# Patient Record
Sex: Female | Born: 1935 | Race: White | Hispanic: No | Smoking: Never smoker
Health system: Southern US, Community
[De-identification: ages and names within clinical notes are randomized; demographics above are authoritative.]

## PROBLEM LIST (undated history)

## (undated) DIAGNOSIS — I671 Cerebral aneurysm, nonruptured: Secondary | ICD-10-CM

## (undated) DIAGNOSIS — K859 Acute pancreatitis without necrosis or infection, unspecified: Secondary | ICD-10-CM

## (undated) DIAGNOSIS — I509 Heart failure, unspecified: Secondary | ICD-10-CM

## (undated) DIAGNOSIS — J8410 Pulmonary fibrosis, unspecified: Secondary | ICD-10-CM

## (undated) HISTORY — DX: Pulmonary fibrosis, unspecified: J84.10

## (undated) HISTORY — DX: Heart failure, unspecified: I50.9

## (undated) HISTORY — PX: OTHER SURGICAL HISTORY: SHX169

## (undated) HISTORY — PX: APPENDECTOMY: SHX54

---

## 1996-11-23 DIAGNOSIS — I671 Cerebral aneurysm, nonruptured: Secondary | ICD-10-CM

## 1996-11-23 HISTORY — DX: Cerebral aneurysm, nonruptured: I67.1

## 2001-05-18 ENCOUNTER — Encounter: Payer: Self-pay | Admitting: Emergency Medicine

## 2001-05-18 ENCOUNTER — Inpatient Hospital Stay (HOSPITAL_COMMUNITY): Admission: EM | Admit: 2001-05-18 | Discharge: 2001-05-30 | Payer: Self-pay | Admitting: Emergency Medicine

## 2001-05-18 ENCOUNTER — Encounter: Payer: Self-pay | Admitting: Internal Medicine

## 2001-05-19 ENCOUNTER — Encounter: Payer: Self-pay | Admitting: Internal Medicine

## 2001-05-20 ENCOUNTER — Encounter: Payer: Self-pay | Admitting: Internal Medicine

## 2001-05-22 ENCOUNTER — Encounter: Payer: Self-pay | Admitting: Internal Medicine

## 2001-05-23 ENCOUNTER — Encounter: Payer: Self-pay | Admitting: Internal Medicine

## 2001-05-25 ENCOUNTER — Encounter: Payer: Self-pay | Admitting: Internal Medicine

## 2001-05-28 ENCOUNTER — Encounter: Payer: Self-pay | Admitting: Internal Medicine

## 2001-06-08 ENCOUNTER — Encounter: Admission: RE | Admit: 2001-06-08 | Discharge: 2001-06-08 | Payer: Self-pay | Admitting: Internal Medicine

## 2005-04-20 ENCOUNTER — Ambulatory Visit: Payer: Self-pay | Admitting: Internal Medicine

## 2006-06-02 ENCOUNTER — Ambulatory Visit: Payer: Self-pay | Admitting: Internal Medicine

## 2007-08-18 ENCOUNTER — Ambulatory Visit: Payer: Self-pay | Admitting: Internal Medicine

## 2008-04-29 ENCOUNTER — Emergency Department: Payer: Self-pay | Admitting: Emergency Medicine

## 2008-08-21 ENCOUNTER — Ambulatory Visit: Payer: Self-pay | Admitting: Internal Medicine

## 2009-08-22 ENCOUNTER — Ambulatory Visit: Payer: Self-pay | Admitting: Internal Medicine

## 2010-10-28 ENCOUNTER — Ambulatory Visit: Payer: Self-pay | Admitting: Internal Medicine

## 2011-11-03 ENCOUNTER — Ambulatory Visit: Payer: Self-pay | Admitting: Internal Medicine

## 2012-11-08 ENCOUNTER — Ambulatory Visit: Payer: Self-pay | Admitting: Internal Medicine

## 2013-11-14 ENCOUNTER — Ambulatory Visit: Payer: Self-pay | Admitting: Family Medicine

## 2014-03-30 ENCOUNTER — Inpatient Hospital Stay: Payer: Self-pay | Admitting: Family Medicine

## 2014-03-30 LAB — COMPREHENSIVE METABOLIC PANEL
Albumin: 2.9 g/dL — ABNORMAL LOW (ref 3.4–5.0)
Alkaline Phosphatase: 78 U/L
Anion Gap: 4 — ABNORMAL LOW (ref 7–16)
BUN: 17 mg/dL (ref 7–18)
Bilirubin,Total: 0.8 mg/dL (ref 0.2–1.0)
Calcium, Total: 9.5 mg/dL (ref 8.5–10.1)
Chloride: 101 mmol/L (ref 98–107)
Co2: 30 mmol/L (ref 21–32)
Creatinine: 0.95 mg/dL (ref 0.60–1.30)
EGFR (African American): >60
EGFR (Non-African Amer.): 55 — ABNORMAL LOW
Glucose: 130 mg/dL — ABNORMAL HIGH (ref 65–99)
Osmolality: 273 (ref 275–301)
Potassium: 3.9 mmol/L (ref 3.5–5.1)
SGOT(AST): 25 U/L (ref 15–37)
SGPT (ALT): 17 U/L (ref 12–78)
Sodium: 135 mmol/L — ABNORMAL LOW (ref 136–145)
Total Protein: 7.3 g/dL (ref 6.4–8.2)

## 2014-03-30 LAB — CBC
HCT: 47 % (ref 35.0–47.0)
HGB: 14.9 g/dL (ref 12.0–16.0)
MCH: 30 pg (ref 26.0–34.0)
MCHC: 31.8 g/dL — ABNORMAL LOW (ref 32.0–36.0)
MCV: 94 fL (ref 80–100)
Platelet: 207 10*3/uL (ref 150–440)
RBC: 4.98 10*6/uL (ref 3.80–5.20)
RDW: 13.8 % (ref 11.5–14.5)
WBC: 10.3 10*3/uL (ref 3.6–11.0)

## 2014-03-30 LAB — TROPONIN I: Troponin-I: <0.02 ng/mL

## 2014-03-30 LAB — CK TOTAL AND CKMB (NOT AT ARMC)
CK, Total: 99 U/L
CK-MB: 2.1 ng/mL (ref 0.5–3.6)

## 2014-04-02 LAB — CREATININE, SERUM
Creatinine: 0.83 mg/dL (ref 0.60–1.30)
EGFR (African American): >60
EGFR (Non-African Amer.): >60

## 2014-04-02 LAB — PLATELET COUNT: Platelet: 259 10*3/uL (ref 150–440)

## 2014-04-04 LAB — CULTURE, BLOOD (SINGLE)

## 2015-03-16 NOTE — H&P (Signed)
PATIENT NAME:  [name], [name] MR#:  [id number] DATE OF BIRTH:  [birth year]  DATE OF ADMISSION:  03/30/2014  REFERRING PHYSICIAN:  Dr. Scotty Court  PRIMARY CARE PHYSICIAN:  Dr. Larwance Sachs    CHIEF COMPLAINT:  Shortness of breath.   HISTORY OF PRESENT ILLNESS:  An 81 year old Caucasian female with past medical history of gastroesophageal reflux disease, who presents with shortness of breath. She describes URI-like symptoms for 1 to 2-week duration, as well as associated seasonal allergies. She has been complaining of postnasal drip and occasionally productive cough with clear sputum, as well as associated mild dyspnea on exertion. For the above symptoms, she went to a walk-in clinic and noted to be hypoxemic at that time, saturating around 84% on room air. Subsequently, sent to the Emergency Department for further workup and evaluation. She denies any fevers, chills, or further symptomatology.   REVIEW OF SYSTEMS:  CONSTITUTIONAL:  Denies fever, chills, fatigue, weakness.  EYES:  Denies blurry vision, double vision, eye pain. ENT:  Denies tinnitus, ear pain, hearing loss. Positive postnasal drip.  RESPIRATORY:  Positive for cough, wheeze, shortness of breath as described above.  CARDIOVASCULAR:  Denies chest pain, palpitations, edema.  GASTROINTESTINAL:  Denies nausea, vomiting, diarrhea, abdominal pain.  GENITOURINARY:  Denies dysuria, hematuria.  ENDOCRINE:  Denies nocturia or thyroid problems.  HEMATOLOGIC AND LYMPHATIC:  Denies easy bruising and bleeding.  SKIN:  Denies rashes or lesions. MUSCULOSKELETAL:  Denies pain in neck, back, shoulders, knees, hips, or arthritic symptoms.  NEUROLOGIC:  Denies paralysis or paresthesia.  PSYCHIATRIC:  Denies anxiety or depressive symptoms.  Otherwise, full review of systems performed is negative.   PAST MEDICAL HISTORY:  Vitamin D deficiency, gastroesophageal reflux disease, history of brain aneurysm status post clipping.   SOCIAL HISTORY:  Remote tobacco  usage. Denies any alcohol or drug usage. Uses a cane for ambulation.   FAMILY HISTORY:  Positive for breast cancer.   ALLERGIES:  No known drug allergies.   HOME MEDICATIONS:  Include certrizine 10 mg p.o. daily, ursodiol 300 mg p.o. b.i.d., CoQ10 at 50 mg p.o. daily, and Prilosec 20 mg p.o. daily.   PHYSICAL EXAMINATION:  VITAL SIGNS:  Temperature 98.2, heart rate 85, respirations 20, blood pressure 123/59, saturating 84% on room air, saturating 94% on 2 L cannula. Weight 73.5 kg, BMI 31.6.  GENERAL:  Well-nourished, well-developed, Caucasian female, currently in no acute distress.  HEAD:  Normocephalic, atraumatic.  EYES:  Pupils equal, round, reactive to light. Extraocular muscles intact. No scleral icterus.  MOUTH:  Moist mucous membrane. Dentition intact. No abscess noted. EARS, NOSE, THROAT:  Throat clear without exudates. No external lesions. NECK:  Supple. No thyromegaly, no nodules, no JVD.  PULMONARY:  Diffuse scant expiratory wheeze with bibasilar rhonchi noted. No use of accessory muscles. Good respiratory effort.  CHEST:  Nontender to palpation.  CARDIOVASCULAR:  S1, S2. Regular rate and rhythm. No murmurs, rubs, or gallops. No edema. Pedal pulses 2+ bilaterally. GASTROINTESTINAL:  Soft, nontender, nondistended. No masses. Positive bowel sounds. No hepatosplenomegaly.  MUSCULOSKELETAL:  No swelling, clubbing, edema. Range of motion full in all extremities.  NEUROLOGIC:  Cranial nerves II through XII intact. No gross focal neurological deficits. Sensation intact. Reflexes intact. SKIN:  No ulcerations, lesions, rashes, cyanosis. Skin warm and dry. Turgor intact.  PSYCHIATRIC:  Mood and affect within normal limits. The patient is awake, alert, and oriented x3. Insight and judgment intact.   LABORATORY DATA:  Sodium 135, potassium 3.9, chloride 101, bicarb 30, BUN 17, creatinine 0.95,  glucose 130, albumin 2.9. Otherwise LFTs within normal limits. WBC 10.3, hemoglobin 14.9,  platelets 207. ABG performed; 7.34/50/99/27. Chest x-ray performed; revealing findings consistent with chronic interstitial lung disease/pulmonary fibrosis. However, no acute cardiopulmonary process.   ASSESSMENT AND PLAN:  An 81 year old Caucasian female presenting with shortness of breath.  1.  Hypoxia/bronchitis. Provide supplemental oxygen to keep oxygen saturation greater than 90%. DuoNeb therapy q.4 hours. Solu-Medrol 60 mg IV daily, as well as flutter valve therapy. We will provide Levaquin for antibiotic coverage, as well as its anti-inflammatory effects.  2.  Hyponatremia. Gentle IV fluid hydration with normal saline. Follow sodium levels. 3.  Gastroesophageal reflux disease. Continue PPI therapy.  4.  Venous thromboembolism prophylaxis with heparin subcutaneously.   The patient is FULL CODE.  TIME SPENT: 45 minutes.   ____________________________ Kellie Athensavid K. Hower, MD dkh:ms D: 03/30/2014 22:56:40 ET T: 03/30/2014 23:48:43 ET JOB#: [id number]  cc: Kellie Athensavid K. Hower, MD, <Dictator> DAVID Synetta ShadowK HOWER MD ELECTRONICALLY SIGNED 03/31/2014 20:48

## 2015-03-16 NOTE — Discharge Summary (Signed)
PATIENT NAME:  [name], [name] MR#:  [id number] DATE OF BIRTH:  1926/05/31  DATE OF ADMISSION:  03/30/2014 DATE OF DISCHARGE:  04/02/2014  REASON FOR ADMISSION: Acute respiratory failure with hypoxemia secondary to acute bronchitis.  DISPOSITION: Home with oxygen. To be re-evaluated by primary care physician for the need of continuation within the next 2 to 4 weeks.   HISTORY AND HOSPITAL COURSE: The patient is an 81 year old female who has history of GERD and also pulmonary fibrosis as described by the family with some COPD, although she has kept herself very healthy and not needing any oxygen at home or even respiratory treatments. Whenever she gets bronchitis, she always has some desaturation that could last for several days. I am not quite sure if this is the progression of her pulmonary fibrosis, but the patient remains hypoxic for what she technically qualifies for oxygen at home as she desats to 84 during ambulation. The patient has had significant improvement since admission with decreased secretions and decreased cough, although her hypoxia continues. The patient is going to be discharged on home oxygen. As far as her presentation, she described upper respiratory like symptoms for 1 to 2 weeks to the point that she cannot tolerate it for what she was seen by urgent care. Urgent care decided to send her to the Emergency Department. In the Emergency Department, she was found to have an oxygen saturation of 84%.   The patient was admitted on antibiotics with antibiotics with Levaquin, steroids. She was given Advair inhaler and nebulizer for albuterol and she had significant improvement of her symptomatology.   The patient was started and Solu-Medrol IV and now she has been changed for a short course of for prednisone.   I had a long discussion with the family today about the need for the patient to be hospitalized. They did not feel like that she needs to stay any longer. I agree with them. We are  going to send her home today.   DISCHARGE MEDICATIONS: Omeprazole 20 mg daily, ursodiol 300 mg twice daily, cetirizine 10 mg daily, PreserVision 1 capsule twice daily, CoQ10 enzyme once daily, dextromethorphan with guaifenesin syrup as needed over-the-counter, and prednisone 20 mg once a day for 2 days.   The patient is discharged in good condition. Home oxygen to be taken continuously for now, but probably she is only going to need that for activity as she desats mostly with activity. To be reevaluated by primary care physician for the need of continuation in the next 1 to 2 weeks. Follow-up with Dr. Kandyce RudMarcus Babaoff  in the next 7 days.   TIME SPENT: About 45 minutes.  ____________________________ Felipa Furnaceoberto Sanchez Gutierrez, MD rsg:sb D: 04/02/2014 10:11:52 ET T: 04/02/2014 11:05:26 ET JOB#: [id number]  cc: Felipa Furnaceoberto Sanchez Gutierrez, MD, <Dictator> Kandyce RudMarcus Babaoff, MD Pearletha FurlOBERTO SANCHEZ GUTIERRE MD ELECTRONICALLY SIGNED 04/13/2014 22:10

## 2017-01-15 ENCOUNTER — Emergency Department: Payer: Medicare Other

## 2017-01-15 ENCOUNTER — Inpatient Hospital Stay
Admission: EM | Admit: 2017-01-15 | Discharge: 2017-01-23 | DRG: 291 | Disposition: A | Discharge status: Home Health | Payer: Medicare Other | Attending: Internal Medicine | Admitting: Internal Medicine

## 2017-01-15 DIAGNOSIS — Z7951 Long term (current) use of inhaled steroids: Secondary | ICD-10-CM

## 2017-01-15 DIAGNOSIS — R531 Weakness: Secondary | ICD-10-CM | POA: Diagnosis present

## 2017-01-15 DIAGNOSIS — I82401 Acute embolism and thrombosis of unspecified deep veins of right lower extremity: Secondary | ICD-10-CM

## 2017-01-15 DIAGNOSIS — K219 Gastro-esophageal reflux disease without esophagitis: Secondary | ICD-10-CM | POA: Diagnosis present

## 2017-01-15 DIAGNOSIS — K861 Other chronic pancreatitis: Secondary | ICD-10-CM | POA: Diagnosis present

## 2017-01-15 DIAGNOSIS — Z79899 Other long term (current) drug therapy: Secondary | ICD-10-CM

## 2017-01-15 DIAGNOSIS — Z8679 Personal history of other diseases of the circulatory system: Secondary | ICD-10-CM

## 2017-01-15 DIAGNOSIS — I509 Heart failure, unspecified: Secondary | ICD-10-CM

## 2017-01-15 DIAGNOSIS — J849 Interstitial pulmonary disease, unspecified: Secondary | ICD-10-CM | POA: Diagnosis present

## 2017-01-15 DIAGNOSIS — Z9981 Dependence on supplemental oxygen: Secondary | ICD-10-CM

## 2017-01-15 DIAGNOSIS — J9601 Acute respiratory failure with hypoxia: Secondary | ICD-10-CM | POA: Diagnosis present

## 2017-01-15 DIAGNOSIS — J8410 Pulmonary fibrosis, unspecified: Secondary | ICD-10-CM

## 2017-01-15 DIAGNOSIS — R600 Localized edema: Secondary | ICD-10-CM

## 2017-01-15 DIAGNOSIS — Z66 Do not resuscitate: Secondary | ICD-10-CM | POA: Diagnosis present

## 2017-01-15 DIAGNOSIS — F0390 Unspecified dementia without behavioral disturbance: Secondary | ICD-10-CM | POA: Diagnosis present

## 2017-01-15 DIAGNOSIS — N183 Chronic kidney disease, stage 3 (moderate): Secondary | ICD-10-CM | POA: Diagnosis present

## 2017-01-15 DIAGNOSIS — G9341 Metabolic encephalopathy: Secondary | ICD-10-CM

## 2017-01-15 DIAGNOSIS — I82409 Acute embolism and thrombosis of unspecified deep veins of unspecified lower extremity: Secondary | ICD-10-CM

## 2017-01-15 DIAGNOSIS — R339 Retention of urine, unspecified: Secondary | ICD-10-CM | POA: Diagnosis present

## 2017-01-15 DIAGNOSIS — J9621 Acute and chronic respiratory failure with hypoxia: Secondary | ICD-10-CM

## 2017-01-15 DIAGNOSIS — N179 Acute kidney failure, unspecified: Secondary | ICD-10-CM

## 2017-01-15 DIAGNOSIS — Z9109 Other allergy status, other than to drugs and biological substances: Secondary | ICD-10-CM

## 2017-01-15 DIAGNOSIS — I130 Hypertensive heart and chronic kidney disease with heart failure and stage 1 through stage 4 chronic kidney disease, or unspecified chronic kidney disease: Secondary | ICD-10-CM | POA: Diagnosis not present

## 2017-01-15 DIAGNOSIS — I5031 Acute diastolic (congestive) heart failure: Secondary | ICD-10-CM | POA: Insufficient documentation

## 2017-01-15 DIAGNOSIS — H9190 Unspecified hearing loss, unspecified ear: Secondary | ICD-10-CM | POA: Diagnosis present

## 2017-01-15 HISTORY — DX: Acute pancreatitis without necrosis or infection, unspecified: K85.90

## 2017-01-15 HISTORY — DX: Cerebral aneurysm, nonruptured: I67.1

## 2017-01-15 LAB — URINALYSIS, ROUTINE W REFLEX MICROSCOPIC
Bilirubin Urine: NEGATIVE
GLUCOSE, UA: 50 mg/dL — AB
HGB URINE DIPSTICK: NEGATIVE
Ketones, ur: 5 mg/dL — AB
Leukocytes, UA: NEGATIVE
Nitrite: NEGATIVE
Protein, ur: 100 mg/dL — AB
SPECIFIC GRAVITY, URINE: 1.027 (ref 1.005–1.030)
pH: 5 (ref 5.0–8.0)

## 2017-01-15 LAB — BASIC METABOLIC PANEL
Anion gap: 10 (ref 5–15)
BUN: 32 mg/dL — AB (ref 6–20)
CALCIUM: 10.1 mg/dL (ref 8.9–10.3)
CO2: 29 mmol/L (ref 22–32)
Chloride: 102 mmol/L (ref 101–111)
Creatinine, Ser: 1.49 mg/dL — ABNORMAL HIGH (ref 0.44–1.00)
GFR calc Af Amer: 35 mL/min — ABNORMAL LOW (ref >60)
GFR, EST NON AFRICAN AMERICAN: 30 mL/min — AB (ref >60)
GLUCOSE: 137 mg/dL — AB (ref 65–99)
Potassium: 4.8 mmol/L (ref 3.5–5.1)
SODIUM: 141 mmol/L (ref 135–145)

## 2017-01-15 LAB — CBC WITH DIFFERENTIAL/PLATELET
BASOS ABS: 0 10*3/uL (ref 0–0.1)
Basophils Relative: 1 %
EOS ABS: 0 10*3/uL (ref 0–0.7)
EOS PCT: 0 %
HCT: 49.6 % — ABNORMAL HIGH (ref 35.0–47.0)
Hemoglobin: 16 g/dL (ref 12.0–16.0)
LYMPHS ABS: 0.5 10*3/uL — AB (ref 1.0–3.6)
LYMPHS PCT: 7 %
MCH: 31.5 pg (ref 26.0–34.0)
MCHC: 32.3 g/dL (ref 32.0–36.0)
MCV: 97.4 fL (ref 80.0–100.0)
MONO ABS: 0.6 10*3/uL (ref 0.2–0.9)
Monocytes Relative: 10 %
Neutro Abs: 5.2 10*3/uL (ref 1.4–6.5)
Neutrophils Relative %: 82 %
PLATELETS: 167 10*3/uL (ref 150–440)
RBC: 5.09 MIL/uL (ref 3.80–5.20)
RDW: 13.9 % (ref 11.5–14.5)
WBC: 6.4 10*3/uL (ref 3.6–11.0)

## 2017-01-15 LAB — TSH: TSH: 1.255 u[IU]/mL (ref 0.350–4.500)

## 2017-01-15 LAB — TROPONIN I
Troponin I: 0.03 ng/mL (ref <0.03)
Troponin I: 0.03 ng/mL (ref <0.03)

## 2017-01-15 LAB — BRAIN NATRIURETIC PEPTIDE: B NATRIURETIC PEPTIDE 5: 1854 pg/mL — AB (ref 0.0–100.0)

## 2017-01-15 MED ORDER — HEPARIN SODIUM (PORCINE) 5000 UNIT/ML IJ SOLN
5000.0000 [IU] | Freq: Three times a day (TID) | INTRAMUSCULAR | Status: DC
Start: 2017-01-15 — End: 2017-01-17
  Administered 2017-01-16 – 2017-01-17 (×3): 5000 [IU] via SUBCUTANEOUS
  Filled 2017-01-15 (×4): qty 1

## 2017-01-15 MED ORDER — ACETAMINOPHEN 325 MG PO TABS
650.0000 mg | ORAL_TABLET | ORAL | Status: DC | PRN
  Administered 2017-01-16: 650 mg via ORAL
  Filled 2017-01-15: qty 2

## 2017-01-15 MED ORDER — ASPIRIN EC 81 MG PO TBEC
81.0000 mg | DELAYED_RELEASE_TABLET | Freq: Every day | ORAL | Status: DC
  Administered 2017-01-16 – 2017-01-23 (×8): 81 mg via ORAL
  Filled 2017-01-15 (×10): qty 1

## 2017-01-15 MED ORDER — VITAMIN D 1000 UNITS PO TABS
1000.0000 [IU] | ORAL_TABLET | Freq: Every day | ORAL | Status: DC
  Administered 2017-01-16 – 2017-01-23 (×8): 1000 [IU] via ORAL
  Filled 2017-01-15 (×9): qty 1

## 2017-01-15 MED ORDER — FUROSEMIDE 10 MG/ML IJ SOLN
40.0000 mg | Freq: Every day | INTRAMUSCULAR | Status: DC
  Administered 2017-01-15 – 2017-01-16 (×2): 40 mg via INTRAVENOUS
  Filled 2017-01-15 (×2): qty 4

## 2017-01-15 MED ORDER — FUROSEMIDE 10 MG/ML IJ SOLN
40.0000 mg | Freq: Once | INTRAMUSCULAR | Status: AC
  Administered 2017-01-15: 40 mg via INTRAVENOUS
  Filled 2017-01-15: qty 4

## 2017-01-15 MED ORDER — LORATADINE 10 MG PO TABS
10.0000 mg | ORAL_TABLET | Freq: Every day | ORAL | Status: DC
  Administered 2017-01-16 – 2017-01-23 (×8): 10 mg via ORAL
  Filled 2017-01-15 (×9): qty 1

## 2017-01-15 MED ORDER — LISINOPRIL 5 MG PO TABS
2.5000 mg | ORAL_TABLET | Freq: Every day | ORAL | Status: DC
  Administered 2017-01-15 – 2017-01-16 (×2): 2.5 mg via ORAL
  Filled 2017-01-15 (×2): qty 1

## 2017-01-15 MED ORDER — SODIUM CHLORIDE 0.9 % IV SOLN: 250.0000 mL | INTRAVENOUS | Status: DC | PRN

## 2017-01-15 MED ORDER — PANTOPRAZOLE SODIUM 40 MG PO TBEC
40.0000 mg | DELAYED_RELEASE_TABLET | Freq: Every day | ORAL | Status: DC
  Administered 2017-01-15 – 2017-01-23 (×9): 40 mg via ORAL
  Filled 2017-01-15 (×10): qty 1

## 2017-01-15 MED ORDER — PRESERVISION AREDS PO TABS: 1.0000 | ORAL_TABLET | Freq: Two times a day (BID) | ORAL | Status: DC

## 2017-01-15 MED ORDER — ONDANSETRON HCL 4 MG/2ML IJ SOLN: 4.0000 mg | Freq: Four times a day (QID) | INTRAMUSCULAR | Status: DC | PRN

## 2017-01-15 MED ORDER — SODIUM CHLORIDE 0.9% FLUSH: 3.0000 mL | INTRAVENOUS | Status: DC | PRN

## 2017-01-15 MED ORDER — OCUVITE-LUTEIN PO CAPS
1.0000 | ORAL_CAPSULE | Freq: Two times a day (BID) | ORAL | Status: DC
  Administered 2017-01-16 – 2017-01-23 (×15): 1 via ORAL
  Filled 2017-01-15 (×18): qty 1

## 2017-01-15 MED ORDER — CARVEDILOL 3.125 MG PO TABS
3.1250 mg | ORAL_TABLET | Freq: Two times a day (BID) | ORAL | Status: DC
  Administered 2017-01-16 – 2017-01-18 (×5): 3.125 mg via ORAL
  Filled 2017-01-15 (×5): qty 1

## 2017-01-15 MED ORDER — SODIUM CHLORIDE 0.9% FLUSH
3.0000 mL | Freq: Two times a day (BID) | INTRAVENOUS | Status: DC
  Administered 2017-01-16 – 2017-01-23 (×14): 3 mL via INTRAVENOUS

## 2017-01-15 NOTE — ED Notes (Signed)
Family and pt updated on delay for bed.

## 2017-01-15 NOTE — ED Triage Notes (Addendum)
Pt came to ED via EMS from doctors office. Was getting cortisone shot for knee pain, when doctor noticed patient gained 20lbs in 2 months and is concerned for CHF. Pt not normally on oxygen. Placed on 4L [place] and satting 93%.

## 2017-01-15 NOTE — ED Notes (Signed)
Floor was called prior to departure

## 2017-01-15 NOTE — ED Notes (Signed)
Report from felicia, rn.  

## 2017-01-15 NOTE — H&P (Signed)
History and Physical   [facility] - [place] @ [facility] Admission History and Physical [employer], D.O.    Patient Name: [name] MR#: [id number] Date of Birth: Sep 28, 1926 Date of Admission: 01/15/2017  Referring MD/NP/PA: Dr. Mayford Knife Primary Care Physician: Rozanna Box, MD Patient coming from: Home  Chief Complaint: Weight gain  HPI: [name] is a 83 y.o. female with a known history of brain aneurysm and pancreatitis who was sent to the emergency department by her primary care provider. Patient reportedly went to the doctor for knee injections. She was found to have some abnormal breath sounds in the provider determined that she had gained about 20 pounds since December. He therefore referred her to the emergency department for evaluation. Her family reports that she had been in somewhat short of breath on her discharge from the hospital last year and was discharged home with home O2 which she never uses. She was also tried on Symbicort for symptoms of shortness of breath which did not help any. Her family reports that she has had lower extremity edema, progressively worsening shortness of breath over the last 3 days with decreased energy.   Patient is actually independent, lives at home with her son and daughter-in-law.   Otherwise there has been no change in status. Patient has been taking medication as prescribed and there has been no recent change in medication or diet.  No recent antibiotics.  There has been no recent illness, hospitalizations, travel or sick contacts.    Patient denies fevers/chills, weakness, dizziness, chest pain, shortness of breath, N/V/C/D, abdominal pain, dysuria/frequency, changes in mental status.   ED Course: patient received 40 Lasix  Review of Systems:  CONSTITUTIONAL: No fever/chills, fatigue, weakness, weight gain/loss, headache. EYES: No blurry or double vision. ENT: No tinnitus, postnasal drip, redness or soreness of the  oropharynx. RESPIRATORY: Positive cough, dyspnea, wheeze.  No hemoptysis.  CARDIOVASCULAR: No chest pain, palpitations, syncope, orthopnea. Positive lower extremity edema.  GASTROINTESTINAL: No nausea, vomiting, abdominal pain, diarrhea, constipation.  No hematemesis, melena or hematochezia. GENITOURINARY: No dysuria, frequency, hematuria. ENDOCRINE: No polyuria or nocturia. No heat or cold intolerance. HEMATOLOGY: No anemia, bruising, bleeding. INTEGUMENTARY: No rashes, ulcers, lesions. MUSCULOSKELETAL: No arthritis, gout, dyspnea. NEUROLOGIC: No numbness, tingling, ataxia, seizure-type activity, weakness. PSYCHIATRIC: No anxiety, depression, insomnia.   Past Medical History:  Diagnosis Date  . Brain aneurysm 1998  . Pancreatitis   I per lipidemia GERD Allergies  Past Surgical History:  Procedure Laterality Date  . APPENDECTOMY       reports that she has never smoked. She has never used smokeless tobacco. She reports that she does not drink alcohol or use drugs.  No Known Allergies  Family history significant for some type 2 diabetes   Prior to Admission medications   Medication Sig Start Date End Date Taking? Authorizing Provider  Budesonide (PULMICORT FLEXHALER) 90 MCG/ACT inhaler Inhale 1 puff into the lungs 2 (two) times daily.   Yes Historical Provider, MD  cetirizine (ZYRTEC) 10 MG tablet Take 10 mg by mouth daily.   Yes Historical Provider, MD  cholecalciferol (VITAMIN D) 1000 units tablet Take 1,000 Units by mouth daily.   Yes Historical Provider, MD  Multiple Vitamins-Minerals (PRESERVISION AREDS) TABS Take 1 tablet by mouth 2 (two) times daily.   Yes Historical Provider, MD  omeprazole (PRILOSEC) 20 MG capsule Take 20 mg by mouth daily.   Yes Historical Provider, MD    Physical Exam: Vitals:   01/15/17 1748 01/15/17 1800 01/15/17  1830 01/15/17 1900  BP:  110/72 124/88 129/87  Pulse:  93 92 87  Resp:    (!) 24  Temp:      TempSrc:      SpO2:  90% 92% 98%   Height: 5\' 3"  (1.6 m)       GENERAL: 83 y.o.-year-old Obese white femalet, well-developed, well-nourished lying in the bed in no acute distress.  Pleasant and cooperative.   HEENT: Head atraumatic, normocephalic. Pupils equal, round, reactive to light and accommodation. No scleral icterus. Extraocular muscles intact. Nares are patent. Oropharynx is clear. Mucus membranes moist. patient is hard of hearing  NECK: Supple, full range of motion. No JVD, no bruit heard. No thyroid enlargement, no tenderness, no cervical lymphadenopbibasilar rales with occasional wheeze . No use of accessory muscles of respiration.  No reproducible chest wall tenderness.  CARDIOVASCULAR: S1, S2 normal. No murmurs, rubs, or gallops. Cap refill <2 seconds. Pulses intact distally.  ABDOMEN: Soft, nondistended, nontender. No rebound, guarding, rigidity. Normoactive bowel sounds present in all four quadrants. No organomegaly or mass. EXTREMITIESMild nonpitting edema of bilateral lower extremities to calf. No cyanosis, or clubbing. No calf tenderness or Homan's sign.  NEUROLOGIC: The patient is alert and oriented x 3. Cranial nerves II through XII are grossly intact with no focal sensorimotor deficit. Muscle strength 5/5 in all extremities. Sensation intact. Gait not checked. PSYCHIATRIC:  Normal affect, mood, thought content. SKIN: Warm, dry, and intact without obvious rash, lesion, or ulcer.    Labs on Admission:  CBC:  Recent Labs Lab 01/15/17 1729  WBC 6.4  NEUTROABS 5.2  HGB 16.0  HCT 49.6*  MCV 97.4  PLT 167   Basic Metabolic Panel:  Recent Labs Lab 01/15/17 1729  NA 141  K 4.8  CL 102  CO2 29  GLUCOSE 137*  BUN 32*  CREATININE 1.49*  CALCIUM 10.1   GFR: CrCl cannot be calculated (Unknown ideal weight.). Liver Function Tests: No results for input(s): AST, ALT, ALKPHOS, BILITOT, PROT, ALBUMIN in the last 168 hours. No results for input(s): LIPASE, AMYLASE in the last 168 hours. No results for  input(s): AMMONIA in the last 168 hours. Coagulation Profile: No results for input(s): INR, PROTIME in the last 168 hours. Cardiac Enzymes:  Recent Labs Lab 01/15/17 1729  TROPONINI 0.03*   BNP (last 3 results) No results for input(s): PROBNP in the last 8760 hours. HbA1C: No results for input(s): HGBA1C in the last 72 hours. CBG: No results for input(s): GLUCAP in the last 168 hours. Lipid Profile: No results for input(s): CHOL, HDL, LDLCALC, TRIG, CHOLHDL, LDLDIRECT in the last 72 hours. Thyroid Function Tests: No results for input(s): TSH, T4TOTAL, FREET4, T3FREE, THYROIDAB in the last 72 hours. Anemia Panel: No results for input(s): VITAMINB12, FOLATE, FERRITIN, TIBC, IRON, RETICCTPCT in the last 72 hours. Urine analysis:    Component Value Date/Time   COLORURINE AMBER (A) 01/15/2017 1831   APPEARANCEUR CLOUDY (A) 01/15/2017 1831   LABSPEC 1.027 01/15/2017 1831   PHURINE 5.0 01/15/2017 1831   GLUCOSEU 50 (A) 01/15/2017 1831   HGBUR NEGATIVE 01/15/2017 1831   BILIRUBINUR NEGATIVE 01/15/2017 1831   KETONESUR 5 (A) 01/15/2017 1831   PROTEINUR 100 (A) 01/15/2017 1831   NITRITE NEGATIVE 01/15/2017 1831   LEUKOCYTESUR NEGATIVE 01/15/2017 1831   Sepsis Labs: @LABRCNTIP (procalcitonin:4,lacticidven:4) )No results found for this or any previous visit (from the past 240 hour(s)).   Radiological Exams on Admission: Dg Chest 2 View  Result Date: 01/15/2017 CLINICAL DATA:  Possible  CHF. EXAM: CHEST  2 VIEW COMPARISON:  03/30/2014 and chest CT report from 05/25/2001 FINDINGS: Lungs are adequately inflated with mild prominence of the perihilar markings suggesting mild vascular congestion. Left base opacification worse on the frontal film but unchanged on the lateral film compared to the previous exam. This is likely due mostly to patient's known large diaphragmatic/hiatal hernia. Cannot completely exclude atelectasis/ infection in the left base. Moderate cardiomegaly. Calcified plaque  over the aortic arch. Mild degenerate change of the spine. IMPRESSION: Moderate cardiomegaly with findings suggesting mild vascular congestion. Left base opacification likely due to patient's known large hiatal/diaphragmatic hernia, although cannot exclude atelectasis/ infection. Aortic atherosclerosis. Electronically Signed   By: Elberta Fortis M.D.   On: 01/15/2017 17:58    EKG: Normal sinus rhythm at 92bpm with normal axis,right bundle branch block  and nonspecific ST-T wave changes.   Assessment/Plan  This is a 83 y.o. female with a history of brain aneurysm, pancreatitis, GERD, allergies being admitted with:  1.  New onset of congestive heart failure - Admitted to observation, Telemetry monitoring - Lisinopril, Coreg, Lasix ordered.ke/output, daily weight. - Trend troponins, check lipids and TSH. - Echo - O2 and Med-Neb therapy as needed - Cardiology consultation requested.  2. Acute kidney injury  - Follow BMP - Avoid nephrotoxic medications - Bladder scan and place foley catheter if evidence of urinary retention  3. history of GERD -Continue Prilosec  4. History of allergies -Continue cetirizine   Admission status: Observation, telemetry  IV Fluids: HL Diet/Nutrition: HH Consults called: Cardio  DVT Px: Heparin SCDs and early ambulation. Code Status: Full Code  Disposition Plan: To home in <24 hours   All the records are reviewed and case discussed with ED provider. Management plans discussed with the patient and/or family who express understanding and agree with plan of care.  Kashonda Sarkisyan D.O. on 01/15/2017 at 8:02 PM Between 7am to 6pm - Pager - [phone] After 6pm go to [email or web address] - Social research officer, government [facility] [phone] CC: Primary care physician; Rozanna Box, MD   01/15/2017, 8:02 PM

## 2017-01-15 NOTE — ED Notes (Signed)
hospitalist in to see pt and family.

## 2017-01-15 NOTE — ED Provider Notes (Addendum)
[facility] Emergency Department Provider Note        Time seen: ----------------------------------------- 5:38 PM on 01/15/2017 -----------------------------------------    I have reviewed the triage vital signs and the nursing notes.   HISTORY  Chief Complaint No chief complaint on file.    HPI [name] is a 83 y.o. female who presents to the ER for 20 pound weight gain since December, increased shortness of breath and lethargy. Family reports she's had decreased activity level and appears more weak than normal. They deny any recent illness, there plan was to go to the doctor to have a steroid injection in the right knee but she was noted to be markedly short of breath with signs of CHF so she was referred to the ER for further evaluation.She does not have a history of CHF   No past medical history on file.  There are no active problems to display for this patient.   No past surgical history on file.  Allergies Patient has no allergy information on record.  Social History Social History  Substance Use Topics  . Smoking status: Not on file  . Smokeless tobacco: Not on file  . Alcohol use Not on file    Review of Systems Constitutional: Negative for fever. Cardiovascular: Negative for chest pain. Respiratory: Positive for shortness of breath Gastrointestinal: Negative for abdominal pain, vomiting and diarrhea. Genitourinary: Negative for dysuria. Musculoskeletal: Positive for right knee pain Skin: Negative for rash. Neurological: Negative for headaches, focal weakness or numbness.  10-point ROS otherwise negative.  ____________________________________________   PHYSICAL EXAM:  VITAL SIGNS: ED Triage Vitals  Enc Vitals Group     BP      Pulse      Resp      Temp      Temp src      SpO2      Weight      Height      Head Circumference      Peak Flow      Pain Score      Pain Loc      Pain Edu?      Excl. in GC?      Constitutional: Alert, mild distress Eyes: Conjunctivae are normal. PERRL. Normal extraocular movements. ENT   Head: Normocephalic and atraumatic.   Nose: No congestion/rhinnorhea.   Mouth/Throat: Mucous membranes are moist.   Neck: No stridor. Cardiovascular: Normal rate, regular rhythm. No murmurs, rubs, or gallops. Respiratory: Mild tachypnea with diffuse rales bilaterally Gastrointestinal: Soft and nontender. Normal bowel sounds Musculoskeletal: Nontender with normal range of motion in all extremities. No lower extremity tenderness nor edema. Crepitus noted in the right knee Neurologic:  No gross focal neurologic deficits are appreciated. Generalized weakness, nothing focal Skin:  Skin is warm, dry and intact. No rash noted. Psychiatric: Mood and affect are normal.  ____________________________________________  EKG: Interpreted by me. Sinus rhythm with a rate of 92 bpm, normal PR interval, wide QRS, normal QT, right bundle branch block  ____________________________________________  ED COURSE:  Pertinent labs & imaging results that were available during my care of the patient were reviewed by me and considered in my medical decision making (see chart for details). Patient presents to ER with shortness of breath and weakness. We will assess with labs and imaging. Clinical Course as of Jan 15 1841  Fri Jan 15, 2017  [id number] Troponin I: (!!) 0.03 [JW]  1839 B Natriuretic Peptide: (!) 1,854.0 [JW]    Clinical Course  User Index [JW] Emily Filbert, MD   Procedures ____________________________________________   LABS (pertinent positives/negatives)  Labs Reviewed  CBC WITH DIFFERENTIAL/PLATELET - Abnormal; Notable for the following:       Result Value   HCT 49.6 (*)    Lymphs Abs 0.5 (*)    All other components within normal limits  BASIC METABOLIC PANEL - Abnormal; Notable for the following:    Glucose, Bld 137 (*)    BUN 32 (*)    Creatinine, Ser 1.49  (*)    GFR calc non Af Amer 30 (*)    GFR calc Af Amer 35 (*)    All other components within normal limits  BRAIN NATRIURETIC PEPTIDE - Abnormal; Notable for the following:    B Natriuretic Peptide 1,854.0 (*)    All other components within normal limits  TROPONIN I - Abnormal; Notable for the following:    Troponin I 0.03 (*)    All other components within normal limits  URINALYSIS, ROUTINE W REFLEX MICROSCOPIC - Abnormal; Notable for the following:    Color, Urine AMBER (*)    APPearance CLOUDY (*)    Glucose, UA 50 (*)    Ketones, ur 5 (*)    Protein, ur 100 (*)    Bacteria, UA FEW (*)    Squamous Epithelial / LPF 0-5 (*)    All other components within normal limits   CRITICAL CARE Performed by: Emily Filbert   Total critical care time: 30 minutes  Critical care time was exclusive of separately billable procedures and treating other patients.  Critical care was necessary to treat or prevent imminent or life-threatening deterioration.  Critical care was time spent personally by me on the following activities: development of treatment plan with patient and/or surrogate as well as nursing, discussions with consultants, evaluation of patient's response to treatment, examination of patient, obtaining history from patient or surrogate, ordering and performing treatments and interventions, ordering and review of laboratory studies, ordering and review of radiographic studies, pulse oximetry and re-evaluation of patient's condition.  RADIOLOGY Images were viewed by me  Chest x-ray IMPRESSION: Moderate cardiomegaly with findings suggesting mild vascular congestion.  Left base opacification likely due to patient's known large hiatal/diaphragmatic hernia, although cannot exclude atelectasis/ infection.  Aortic atherosclerosis. ____________________________________________  FINAL ASSESSMENT AND PLAN  Dyspnea, weakness, New onset congestive heart failure  Plan: Patient  with labs and imaging as dictated above. Patient presented to the ER with worsening shortness of breath and evidence of congestive heart failure on x-ray and clinically. We have started her on IV diuretics. She will also need to be started on lisinopril and will need to be admitted. I will discuss with the hospitalist.   Emily Filbert, MD   Note: This note was generated in part or whole with voice recognition software. Voice recognition is usually quite accurate but there are transcription errors that can and very often do occur. I apologize for any typographical errors that were not detected and corrected.     Emily Filbert, MD 01/15/17 1842    Emily Filbert, MD 01/15/17 [phone]

## 2017-01-16 ENCOUNTER — Observation Stay
Admit: 2017-01-16 | Discharge: 2017-01-16 | Disposition: A | Discharge status: Home / Self Care | Payer: Medicare Other | Attending: Family Medicine | Admitting: Family Medicine

## 2017-01-16 LAB — BASIC METABOLIC PANEL
ANION GAP: 7 (ref 5–15)
BUN: 30 mg/dL — ABNORMAL HIGH (ref 6–20)
CO2: 31 mmol/L (ref 22–32)
Calcium: 9.5 mg/dL (ref 8.9–10.3)
Chloride: 103 mmol/L (ref 101–111)
Creatinine, Ser: 1.42 mg/dL — ABNORMAL HIGH (ref 0.44–1.00)
GFR calc Af Amer: 37 mL/min — ABNORMAL LOW (ref >60)
GFR calc non Af Amer: 32 mL/min — ABNORMAL LOW (ref >60)
GLUCOSE: 85 mg/dL (ref 65–99)
POTASSIUM: 4.2 mmol/L (ref 3.5–5.1)
Sodium: 141 mmol/L (ref 135–145)

## 2017-01-16 LAB — TROPONIN I
TROPONIN I: 0.03 ng/mL — AB (ref <0.03)
Troponin I: 0.03 ng/mL (ref <0.03)

## 2017-01-16 LAB — LIPID PANEL
CHOLESTEROL: 92 mg/dL (ref 0–200)
HDL: 25 mg/dL — ABNORMAL LOW (ref >40)
LDL CALC: 50 mg/dL (ref 0–99)
TRIGLYCERIDES: 83 mg/dL (ref <150)
Total CHOL/HDL Ratio: 3.7 RATIO
VLDL: 17 mg/dL (ref 0–40)

## 2017-01-16 MED ORDER — FUROSEMIDE 10 MG/ML IJ SOLN
20.0000 mg | Freq: Two times a day (BID) | INTRAMUSCULAR | Status: DC
Start: 2017-01-16 — End: 2017-01-18
  Administered 2017-01-16 – 2017-01-18 (×4): 20 mg via INTRAVENOUS
  Filled 2017-01-16 (×4): qty 2

## 2017-01-16 NOTE — Care Management Obs Status (Signed)
MEDICARE OBSERVATION STATUS NOTIFICATION   Patient Details  Name: [name] MRN: [id number] Date of Birth: 08/01/1926   Medicare Observation Status Notification Given:  Yes    Graylen Noboa A, RN 01/16/2017, 4:29 PM

## 2017-01-16 NOTE — Consult Note (Signed)
[facility]  CARDIOLOGY CONSULT NOTE  Patient ID: Kellie IgoLucy V Patel MRN: [id number] DOB/AGE: 81/11/1925 83 y.o.  Admit date: 01/15/2017 Referring Physician Dr. Imogene Burnhen Primary Physician  Dr. Larwance SachsBabaoff Primary Cardiologist   Reason for Consultation chf  HPI: Pt is an 83 yo female with history of hypertension, hyperlipidemia, brain aneurysm, chronic pancreatitis  and gerd who was admitted after being sent to the er by her pcp after she presented there for knee pain and was noted ot have a 20 pound weight gain in the past severeal months. She has been somewhat sob and was discharged on prevous admission with home oxygne which she is not commpliant with. CXR showed moderate cardiomegaly with findings suggesting mild vascular congestion. She was given iv lasix with some improvement. She has ruled out for an mi with negative troponin. Renal funciton has decreased somewhat since last evaluation 2 years ago with creatinine increase to 1.42 fromm 0.83. She is feeling better today.   Review of Systems  HENT: Negative.   Eyes: Negative.  Negative for photophobia.  Respiratory: Positive for shortness of breath.   Cardiovascular: Positive for leg swelling.  Gastrointestinal: Negative.   Genitourinary: Negative.   Musculoskeletal: Negative.   Skin: Negative.   Neurological: Positive for weakness.  Endo/Heme/Allergies: Negative.   Psychiatric/Behavioral: Negative.     Past Medical History:  Diagnosis Date  . Brain aneurysm 1998  . Pancreatitis     No family history on file.  Social History   Social History  . Marital status: Widowed    Spouse name: N/A  . Number of children: N/A  . Years of education: N/A   Occupational History  . Not on file.   Social History Main Topics  . Smoking status: Never Smoker  . Smokeless tobacco: Never Used  . Alcohol use No  . Drug use: No  . Sexual activity: Not on file   Other Topics Concern  . Not on file   Social  History Narrative  . No narrative on file    Past Surgical History:  Procedure Laterality Date  . APPENDECTOMY       Prescriptions Prior to Admission  Medication Sig Dispense Refill Last Dose  . Budesonide (PULMICORT FLEXHALER) 90 MCG/ACT inhaler Inhale 1 puff into the lungs 2 (two) times daily.   01/15/2017 at am  . cetirizine (ZYRTEC) 10 MG tablet Take 10 mg by mouth daily.   01/15/2017 at am  . cholecalciferol (VITAMIN D) 1000 units tablet Take 1,000 Units by mouth daily.   01/15/2017 at am  . Multiple Vitamins-Minerals (PRESERVISION AREDS) TABS Take 1 tablet by mouth 2 (two) times daily.   01/15/2017 at am  . omeprazole (PRILOSEC) 20 MG capsule Take 20 mg by mouth daily.   01/15/2017 at am    Physical Exam: Blood pressure (!) 88/46, pulse 83, temperature 97.3 F (36.3 C), temperature source Oral, resp. rate 20, height 4\' 11"  (1.499 m), weight 76.6 kg (168 lb 12.8 oz), SpO2 92 %.   Wt Readings from Last 1 Encounters:  01/16/17 76.6 kg (168 lb 12.8 oz)     General appearance: alert and cooperative Resp: rales bibasilar Cardio: regular rate and rhythm GI: soft, non-tender; bowel sounds normal; no masses,  no organomegaly Extremities: edema 2+ edema Neurologic: Grossly normal  Labs:   Lab Results  Component Value Date   WBC 6.4 01/15/2017   HGB 16.0 01/15/2017   HCT 49.6 (H) 01/15/2017   MCV 97.4 01/15/2017  PLT 167 01/15/2017    Recent Labs Lab 01/16/17 0437  NA 141  K 4.2  CL 103  CO2 31  BUN 30*  CREATININE 1.42*  CALCIUM 9.5  GLUCOSE 85   Lab Results  Component Value Date   CKTOTAL 99 03/30/2014   CKMB 2.1 03/30/2014   TROPONINI 0.03 (HH) 01/16/2017      Radiology: mild pulmonary edema EKG: nsr with no ischemia  ASSESSMENT AND PLAN:  83 yo female with history of pancreatitis, hyperlipidemia and hypertension admitted iwht progressive sob and weight gain. She was noted to have mild pulmonary edema on cxr. Has improved with lasix. Echo is pending. Will  continue careful diuresis given acute on chornic renal insiffiency. Low sodium diet.  Signed: Dalia Heading MD, [facility] 01/16/2017, 2:40 PM

## 2017-01-16 NOTE — Progress Notes (Signed)
Physical Therapy Evaluation Patient Details Name: [name] MRN: [id number] DOB: 01/26/1926 Today's Date: 01/16/2017   History of Present Illness  Patient is an 83 y.o. female admitted on 23 FEB from PCP due to 20# weight gain since DEC and SOB; patient reportedly has home O2 but does not use it. Patient was at PCP to get injections in R knee. PMH includes brain aneurysm that impacts short-term memory and pancreatitis.   Clinical Impression  Patient is a pleasant female admitted for above reasons. Family present at time of evaluation to obtain previous functional history. Patient previously living with son/daughter in law at home, [facility] house with reported furniture grabbing and history of fall and using RW outside. Patient's family reports that home O2 is not utilized due to increased fall risk acquired when ambulating with it due to size. At rest without oxygen, patient's O2 levels in upper 70% and in low 90% range on 3L O2. Patient's gait assessed with use of SPC in room while walking to bathroom, and patient demonstrates significant deficits in dynamic balance, grabbing anything and everything with opposite hand. Required minimal assistance to maintain balance. Upon return to chair, utilized RW with improved dynamic balance with no LOB. PT discussed importance of use of RW at all times to prevent falls and use of O2, as resting oxygen saturations are not what family members reported at her baseline. Family was requesting heliose oxygen units for improved ability to carry. It is recommended that patient continue to receive progressive PT while at the hospital and upon discharge home to improve cardiopulmonary status, dynamic balance, strength, and function.    Follow Up Recommendations Home health PT    Equipment Recommendations  None recommended by PT;Other (comment) (Recommended using RW at all times)    Recommendations for Other Services       Precautions / Restrictions  Precautions Precautions: Fall Restrictions Weight Bearing Restrictions: No      Mobility  Bed Mobility                  Transfers Overall transfer level: Needs assistance Equipment used: Rolling walker (2 wheeled);Straight cane Transfers: Sit to/from Stand Sit to Stand: Min assist         General transfer comment: Patient performs sit to stand from chair with CGA but requires minimal assistance from toilet. Demonstrates decreased eccentric control as she moves into sitting, plopping onto chair.  Ambulation/Gait Ambulation/Gait assistance: Min assist Ambulation Distance (Feet): 30 Feet Assistive device: Straight cane;Rolling walker (2 wheeled)       General Gait Details: Patient ambulates with SPC on R and unsteady gait pattern, requiring minA, grabbing wall/chairs with L hand to bathroom. Utilized RW when returning to chair, showing improved balance but decreased awareness of surroundings.  Stairs            Wheelchair Mobility    Modified Rankin (Stroke Patients Only)       Balance Overall balance assessment: Needs assistance;History of Falls Sitting-balance support: Feet supported Sitting balance-Leahy Scale: Good     Standing balance support: Bilateral upper extremity supported Standing balance-Leahy Scale: Fair                               Pertinent Vitals/Pain Pain Assessment: No/denies pain    Home Living Family/patient expects to be discharged to:: Private residence Living Arrangements: Other relatives;Children Available Help at Discharge: Family;Available PRN/intermittently Type of Home: House Home  Access: Stairs to enter   [employer] of Steps: 2 Home Layout: One level Home Equipment: Walker - 2 wheels;Cane - single point;Shower seat - built in;Hand held shower head      Prior Function Level of Independence: Needs assistance   Gait / Transfers Assistance Needed: Patient previously modified independent  with household distances using SPC and furniture grabbing in house and RW outside. One fall in recent past.  ADL's / Homemaking Assistance Needed: Performed by son and daughter in Garment/textile technologist [employer]        Extremity/Trunk Assessment   Upper Extremity Assessment Upper Extremity Assessment: Generalized weakness    Lower Extremity Assessment Lower Extremity Assessment: Generalized weakness       Communication   Communication: No difficulties  Cognition Arousal/Alertness: Awake/alert Behavior During Therapy: WFL for tasks assessed/performed Overall Cognitive Status: History of cognitive impairments - at baseline                      General Comments      Exercises     Assessment/Plan    PT Assessment Patient needs continued PT services  PT Problem List Decreased strength;Decreased activity tolerance;Decreased balance;Decreased mobility;Decreased cognition;Decreased knowledge of use of DME;Decreased safety awareness;Cardiopulmonary status limiting activity       PT Treatment Interventions DME instruction;Gait training;Stair training;Functional mobility training;Therapeutic activities;Therapeutic exercise;Balance training;Cognitive remediation;Patient/family education    PT Goals (Current goals can be found in the Care Plan section)  Acute Rehab PT Goals Patient Stated Goal: Unstated PT Goal Formulation: With patient/family Time For Goal Achievement: 01/30/17 Potential to Achieve Goals: Good    Frequency Min 2X/week   Barriers to discharge        Co-evaluation               End of Session Equipment Utilized During Treatment: Gait belt;Oxygen Activity Tolerance: Patient limited by fatigue Patient left: in chair;with call bell/phone within reach;with chair alarm set;with family/visitor present Nurse Communication: Mobility status PT Visit Diagnosis: Unsteadiness on feet (R26.81);History of falling (Z91.81);Difficulty in walking, not  elsewhere classified (R26.2);Muscle weakness (generalized) (M62.81)    Functional Assessment Tool Used: AM-PAC 6 Clicks Basic Mobility;Clinical judgement Functional Limitation: Mobility: Walking and moving around Mobility: Walking and Moving Around Current Status (Z6109): At least 40 percent but less than 60 percent impaired, limited or restricted Mobility: Walking and Moving Around Goal Status [phone]): At least 40 percent but less than 60 percent impaired, limited or restricted    Time: 1013-1046 PT Time Calculation (min) (ACUTE ONLY): 33 min   Charges:   PT Evaluation $PT Eval Low Complexity: 1 Procedure     PT G Codes:   PT G-Codes **NOT FOR INPATIENT CLASS** Functional Assessment Tool Used: AM-PAC 6 Clicks Basic Mobility;Clinical judgement Functional Limitation: Mobility: Walking and moving around Mobility: Walking and Moving Around Current Status (U9811): At least 40 percent but less than 60 percent impaired, limited or restricted Mobility: Walking and Moving Around Goal Status [phone]): At least 40 percent but less than 60 percent impaired, limited or restricted     Neita Carp, PT, DPT 01/16/2017, 12:24 PM

## 2017-01-16 NOTE — Progress Notes (Signed)
Pt arrived from ED via stretcher with daughter-n-law at bedside. Telemetry applied and called to CCMD. Foley in place draining clear urine. Yellow socks applied. A&O at times, repeating questions. Oriented to room, call bell with in reach, bed alarm placed.

## 2017-01-16 NOTE — Progress Notes (Addendum)
[facility] [place] [facility] [place]   PATIENT NAME: [name]    MR#:  [id number]  DATE OF BIRTH:  08/14/1926  SUBJECTIVE:  CHIEF COMPLAINT:   Chief Complaint  Patient presents with  . Weight Gain   Weakness, Shortness of breath and leg swelling. On oxygen [place] 5 L this am, down to 3 L but hypoxia at 88%. REVIEW OF SYSTEMS:  Review of Systems  Constitutional: Positive for malaise/fatigue. Negative for chills and fever.  HENT: Negative for congestion and sore throat.   Eyes: Negative for blurred vision and double vision.  Respiratory: Positive for cough and shortness of breath. Negative for hemoptysis, sputum production, wheezing and stridor.   Cardiovascular: Positive for leg swelling. Negative for chest pain.  Gastrointestinal: Negative for abdominal pain, blood in stool, constipation, diarrhea, melena, nausea and vomiting.  Genitourinary: Negative for dysuria and hematuria.  Musculoskeletal: Negative for joint pain.  Skin: Negative for itching and rash.  Neurological: Positive for weakness. Negative for dizziness, focal weakness and loss of consciousness.  Psychiatric/Behavioral: Negative for depression. The patient is not nervous/anxious.     DRUG ALLERGIES:  No Known Allergies VITALS:  Blood pressure (!) 88/46, pulse 83, temperature 97.3 F (36.3 C), temperature source Oral, resp. rate 20, height 4\' 11"  (1.499 m), weight 168 lb 12.8 oz (76.6 kg), SpO2 92 %. PHYSICAL EXAMINATION:  Physical Exam  Constitutional: She is oriented to person, place, and time and well-developed, well-nourished, and in no distress.  HENT:  Head: Normocephalic.  Mouth/Throat: Oropharynx is clear and moist.  Eyes: Conjunctivae and EOM are normal. No scleral icterus.  Neck: Normal range of motion. Neck supple. No JVD present. No tracheal deviation present.  Cardiovascular: Normal rate, regular rhythm and normal heart sounds.  Exam reveals no gallop.   No murmur  heard. Pulmonary/Chest: Effort normal. No respiratory distress. She has no wheezes. She has rales. She exhibits no tenderness.  Abdominal: Soft. Bowel sounds are normal. She exhibits no distension. There is no tenderness. There is no rebound.  Musculoskeletal: Normal range of motion. She exhibits edema. She exhibits no tenderness.  Neurological: She is alert and oriented to person, place, and time. No cranial nerve deficit.  Skin: No rash noted. No erythema.  Psychiatric: Affect normal.   LABORATORY PANEL:  Female CBC  Recent Labs Lab 01/15/17 1729  WBC 6.4  HGB 16.0  HCT 49.6*  PLT 167   ------------------------------------------------------------------------------------------------------------------ Chemistries   Recent Labs Lab 01/16/17 0437  NA 141  K 4.2  CL 103  CO2 31  GLUCOSE 85  BUN 30*  CREATININE 1.42*  CALCIUM 9.5   RADIOLOGY:  Dg Chest 2 View  Result Date: 01/15/2017 CLINICAL DATA:  Possible CHF. EXAM: CHEST  2 VIEW COMPARISON:  03/30/2014 and chest CT report from 05/25/2001 FINDINGS: Lungs are adequately inflated with mild prominence of the perihilar markings suggesting mild vascular congestion. Left base opacification worse on the frontal film but unchanged on the lateral film compared to the previous exam. This is likely due mostly to patient's known large diaphragmatic/hiatal hernia. Cannot completely exclude atelectasis/ infection in the left base. Moderate cardiomegaly. Calcified plaque over the aortic arch. Mild degenerate change of the spine. IMPRESSION: Moderate cardiomegaly with findings suggesting mild vascular congestion. Left base opacification likely due to patient's known large hiatal/diaphragmatic hernia, although cannot exclude atelectasis/ infection. Aortic atherosclerosis. Electronically Signed   By: Elberta Fortis M.D.   On: 01/15/2017 17:58   ASSESSMENT AND PLAN:   This is  a 83 y.o. female with a history of brain aneurysm, pancreatitis, GERD,  allergies being admitted with:  1.  New onset of congestive heart failure Continue lasix iv bid, Coreg, hold lisinopril due to low BP. Hold if SBP<100 or DBP<60 F/u Echo and cardiology consult.  Acute respiratory failure with hypoxia due to above Try to wean down oxygen, continue Neb therapy as needed  2. Acute kidney injury due to prerenal etiology. - Follow BMP - Avoid nephrotoxic medications   3. history of GERD -Continue Prilosec  4. History of allergies -Continue cetirizine  Generalized weakness. PT evaluation suggested home health and PE. All the records are reviewed and case discussed with Care Management/Social Worker. Management plans discussed with the patient, her daughter and they are in agreement.  CODE STATUS: DNR  TOTAL TIME TAKING CARE OF THIS PATIENT: 38 minutes.   More than 50% of the time was spent in counseling/coordination of care: YES  POSSIBLE D/C IN 3 DAYS, DEPENDING ON CLINICAL CONDITION.   Shaune Pollackhen, Firman Petrow M.D on 01/16/2017 at 2:00 PM  Between 7am to 6pm - Pager - [phone]  After 6pm go to [email or web address] - Social research officer, governmentpassword [email or web address] [facility]  [facility] [place] [facility]  Office  [phone]  CC: Primary care physician; BABAOFF, Lavada MesiMARC E, MD  Note: This dictation was prepared with Dragon dictation along with smaller phrase technology. Any transcriptional errors that result from this process are unintentional.

## 2017-01-16 NOTE — Progress Notes (Signed)
*  PRELIMINARY RESULTS* Echocardiogram 2D Echocardiogram has been performed.  Garrel Ridgelikeshia S Jerrad Mendibles 01/16/2017, 3:37 PM

## 2017-01-17 ENCOUNTER — Inpatient Hospital Stay: Payer: Medicare Other

## 2017-01-17 DIAGNOSIS — Z8679 Personal history of other diseases of the circulatory system: Secondary | ICD-10-CM | POA: Diagnosis not present

## 2017-01-17 DIAGNOSIS — G9341 Metabolic encephalopathy: Secondary | ICD-10-CM | POA: Diagnosis present

## 2017-01-17 DIAGNOSIS — J9601 Acute respiratory failure with hypoxia: Secondary | ICD-10-CM | POA: Diagnosis present

## 2017-01-17 DIAGNOSIS — I509 Heart failure, unspecified: Secondary | ICD-10-CM | POA: Diagnosis present

## 2017-01-17 DIAGNOSIS — Z9981 Dependence on supplemental oxygen: Secondary | ICD-10-CM | POA: Diagnosis not present

## 2017-01-17 DIAGNOSIS — I130 Hypertensive heart and chronic kidney disease with heart failure and stage 1 through stage 4 chronic kidney disease, or unspecified chronic kidney disease: Secondary | ICD-10-CM | POA: Diagnosis present

## 2017-01-17 DIAGNOSIS — K861 Other chronic pancreatitis: Secondary | ICD-10-CM | POA: Diagnosis present

## 2017-01-17 DIAGNOSIS — Z7951 Long term (current) use of inhaled steroids: Secondary | ICD-10-CM | POA: Diagnosis not present

## 2017-01-17 DIAGNOSIS — J849 Interstitial pulmonary disease, unspecified: Secondary | ICD-10-CM | POA: Diagnosis present

## 2017-01-17 DIAGNOSIS — F0390 Unspecified dementia without behavioral disturbance: Secondary | ICD-10-CM | POA: Diagnosis present

## 2017-01-17 DIAGNOSIS — Z66 Do not resuscitate: Secondary | ICD-10-CM | POA: Diagnosis present

## 2017-01-17 DIAGNOSIS — Z9109 Other allergy status, other than to drugs and biological substances: Secondary | ICD-10-CM | POA: Diagnosis not present

## 2017-01-17 DIAGNOSIS — R531 Weakness: Secondary | ICD-10-CM | POA: Diagnosis present

## 2017-01-17 DIAGNOSIS — K219 Gastro-esophageal reflux disease without esophagitis: Secondary | ICD-10-CM | POA: Diagnosis present

## 2017-01-17 DIAGNOSIS — H9190 Unspecified hearing loss, unspecified ear: Secondary | ICD-10-CM | POA: Diagnosis present

## 2017-01-17 DIAGNOSIS — I5031 Acute diastolic (congestive) heart failure: Secondary | ICD-10-CM | POA: Diagnosis present

## 2017-01-17 DIAGNOSIS — R339 Retention of urine, unspecified: Secondary | ICD-10-CM | POA: Diagnosis present

## 2017-01-17 DIAGNOSIS — N183 Chronic kidney disease, stage 3 (moderate): Secondary | ICD-10-CM | POA: Diagnosis present

## 2017-01-17 DIAGNOSIS — N179 Acute kidney failure, unspecified: Secondary | ICD-10-CM | POA: Diagnosis present

## 2017-01-17 DIAGNOSIS — Z79899 Other long term (current) drug therapy: Secondary | ICD-10-CM | POA: Diagnosis not present

## 2017-01-17 LAB — BASIC METABOLIC PANEL
ANION GAP: 7 (ref 5–15)
BUN: 39 mg/dL — AB (ref 6–20)
CALCIUM: 9.2 mg/dL (ref 8.9–10.3)
CO2: 35 mmol/L — ABNORMAL HIGH (ref 22–32)
Chloride: 100 mmol/L — ABNORMAL LOW (ref 101–111)
Creatinine, Ser: 1.42 mg/dL — ABNORMAL HIGH (ref 0.44–1.00)
GFR calc Af Amer: 37 mL/min — ABNORMAL LOW (ref >60)
GFR, EST NON AFRICAN AMERICAN: 32 mL/min — AB (ref >60)
Glucose, Bld: 101 mg/dL — ABNORMAL HIGH (ref 65–99)
Potassium: 3.7 mmol/L (ref 3.5–5.1)
SODIUM: 142 mmol/L (ref 135–145)

## 2017-01-17 LAB — MAGNESIUM: MAGNESIUM: 1.6 mg/dL — AB (ref 1.7–2.4)

## 2017-01-17 LAB — ECHOCARDIOGRAM COMPLETE
HEIGHTINCHES: 59 in
WEIGHTICAEL: 2700.8 [oz_av]

## 2017-01-17 MED ORDER — MAGNESIUM SULFATE 2 GM/50ML IV SOLN
2.0000 g | Freq: Once | INTRAVENOUS | Status: AC
  Administered 2017-01-17: 2 g via INTRAVENOUS
  Filled 2017-01-17: qty 50

## 2017-01-17 NOTE — Progress Notes (Signed)
Patients BP 85/55. MD notified. Orders to lay patient flat and reassess.

## 2017-01-17 NOTE — Progress Notes (Signed)
[facility]  SUBJECTIVE: poor historian. SOB per daughter   Vitals:   01/16/17 1926 01/17/17 0426 01/17/17 0500 01/17/17 0813  BP: (!) 81/50 117/67  117/73  Pulse: 89 83  80  Resp: 18 18  18   Temp: 97.4 F (36.3 C) 98.1 F (36.7 C)  97.3 F (36.3 C)  TempSrc: Oral Oral  Oral  SpO2: 93% 97%  94%  Weight:   75.7 kg (166 lb 14.4 oz)   Height:        Intake/Output Summary (Last 24 hours) at 01/17/17 0942 Last data filed at 01/16/17 2108  Gross per 24 hour  Intake              720 ml  Output              900 ml  Net             -180 ml    LABS: Basic Metabolic Panel:  Recent Labs  16/08/9601/24/18 0437 01/17/17 0504  NA 141 142  K 4.2 3.7  CL 103 100*  CO2 31 35*  GLUCOSE 85 101*  BUN 30* 39*  CREATININE 1.42* 1.42*  CALCIUM 9.5 9.2  MG  --  1.6*   Liver Function Tests: No results for input(s): AST, ALT, ALKPHOS, BILITOT, PROT, ALBUMIN in the last 72 hours. No results for input(s): LIPASE, AMYLASE in the last 72 hours. CBC:  Recent Labs  01/15/17 1729  WBC 6.4  NEUTROABS 5.2  HGB 16.0  HCT 49.6*  MCV 97.4  PLT 167   Cardiac Enzymes:  Recent Labs  01/15/17 2204 01/16/17 0437 01/16/17 1049  TROPONINI 0.03* 0.03* 0.03*   BNP: Invalid input(s): POCBNP D-Dimer: No results for input(s): DDIMER in the last 72 hours. Hemoglobin A1C: No results for input(s): HGBA1C in the last 72 hours. Fasting Lipid Panel:  Recent Labs  01/16/17 0437  CHOL 92  HDL 25*  LDLCALC 50  TRIG 83  CHOLHDL 3.7   Thyroid Function Tests:  Recent Labs  01/15/17 2204  TSH 1.255   Anemia Panel: No results for input(s): VITAMINB12, FOLATE, FERRITIN, TIBC, IRON, RETICCTPCT in the last 72 hours.   Physical Exam: Blood pressure 117/73, pulse 80, temperature 97.3 F (36.3 C), temperature source Oral, resp. rate 18, height 4\' 11"  (1.499 m), weight 75.7 kg (166 lb 14.4 oz), SpO2 94 %.   Wt Readings from Last 1 Encounters:  01/17/17 75.7  kg (166 lb 14.4 oz)     General appearance: cooperative and slowed mentation Resp: rhonchi bilaterally Cardio: regular rate and rhythm GI: soft, non-tender; bowel sounds normal; no masses,  no organomegaly Extremities: edema 1-2+ edema.  TELEMETRY: Reviewed telemetry pt in nsr  ASSESSMENT AND PLAN:  Active Problems:   Congestive heart failure (HCC)-improved somewhat with diuresis. Still with crackles. Echo revealed preserved lv function and mild lvh. Dilated rv with mild to moderate increased rv pressure. Pt is sedentary. No obvious unilateral le edema but dvt and possible pulmonary embolus may explain her increased right sided size and pressure. Will proceed with diuresis with iv lasix as you are doing. Would d/c heparin as per pt daughter request unless dvt and or pe diagnosed.      Heart failure (HCC)    Dalia HeadingKenneth A Deisi Salonga, MD, [facility] HospitalFACC 01/17/2017 9:42 AM

## 2017-01-17 NOTE — Progress Notes (Signed)
[facility] - [place] at [facility]   PATIENT NAME: Kellie SalisburyLucy Patel    MR#:  [id number]  DATE OF BIRTH:  01/05/1926  SUBJECTIVE:  CHIEF COMPLAINT:   Chief Complaint  Patient presents with  . Weight Gain   Weakness, Shortness of breath and leg swelling. On oxygen [place] 3 L this am, down to 3 L but hypoxia at 88%. REVIEW OF SYSTEMS:  Review of Systems  Constitutional: Positive for malaise/fatigue. Negative for chills and fever.  HENT: Negative for congestion and sore throat.   Eyes: Negative for blurred vision and double vision.  Respiratory: Positive for cough and shortness of breath. Negative for hemoptysis, sputum production, wheezing and stridor.   Cardiovascular: Positive for leg swelling. Negative for chest pain.  Gastrointestinal: Negative for abdominal pain, blood in stool, constipation, diarrhea, melena, nausea and vomiting.  Genitourinary: Negative for dysuria and hematuria.  Musculoskeletal: Negative for joint pain.  Skin: Negative for itching and rash.  Neurological: Positive for weakness. Negative for dizziness, focal weakness and loss of consciousness.  Psychiatric/Behavioral: Negative for depression. The patient is not nervous/anxious.     DRUG ALLERGIES:  No Known Allergies VITALS:  Blood pressure 117/73, pulse 80, temperature 97.3 F (36.3 C), temperature source Oral, resp. rate 18, height 4\' 11"  (1.499 m), weight 166 lb 14.4 oz (75.7 kg), SpO2 94 %. PHYSICAL EXAMINATION:  Physical Exam  Constitutional: She is oriented to person, place, and time and well-developed, well-nourished, and in no distress.  HENT:  Head: Normocephalic.  Mouth/Throat: Oropharynx is clear and moist.  Eyes: Conjunctivae and EOM are normal. No scleral icterus.  Neck: Normal range of motion. Neck supple. No JVD present. No tracheal deviation present.  Cardiovascular: Normal rate, regular rhythm and normal heart sounds.  Exam reveals no gallop.   No murmur  heard. Pulmonary/Chest: Effort normal. No respiratory distress. She has no wheezes. She has rales. She exhibits no tenderness.  Abdominal: Soft. Bowel sounds are normal. She exhibits no distension. There is no tenderness. There is no rebound.  Musculoskeletal: Normal range of motion. She exhibits edema. She exhibits no tenderness.  Neurological: She is alert and oriented to person, place, and time. No cranial nerve deficit.  Skin: No rash noted. No erythema.  Psychiatric: Affect normal.   LABORATORY PANEL:  Female CBC  Recent Labs Lab 01/15/17 1729  WBC 6.4  HGB 16.0  HCT 49.6*  PLT 167   ------------------------------------------------------------------------------------------------------------------ Chemistries   Recent Labs Lab 01/17/17 0504  NA 142  K 3.7  CL 100*  CO2 35*  GLUCOSE 101*  BUN 39*  CREATININE 1.42*  CALCIUM 9.2  MG 1.6*   RADIOLOGY:  No results found. ASSESSMENT AND PLAN:   This is a 83 y.o. female with a history of brain aneurysm, pancreatitis, GERD, allergies being admitted with:  1.  New onset of congestive heart failure Continue lasix iv bid, Coreg, hold lisinopril due to low BP. Hold if SBP<100 or DBP<60 F/u Echo and cardiology consult.  Acute respiratory failure with hypoxia due to above, still on O2 [place] 3L. Try to wean down oxygen, continue Neb therapy as needed  2. Acute kidney injury due to prerenal etiology. - Follow BMP - Avoid nephrotoxic medications   3. history of GERD -Continue Prilosec  4. History of allergies -Continue cetirizine   Hypomagnesemia. Give IV mag, f/u Mag.  History of brain aneurysm, the daughter has concern and does not want heparin SQ.  Generalized weakness. PT evaluation suggested home health and PE.  All the records are reviewed and case discussed with Care Management/Social Worker. Management plans discussed with the patient, her daughter and they are in agreement.  CODE STATUS: DNR  TOTAL  TIME TAKING CARE OF THIS PATIENT: 42 minutes.   More than 50% of the time was spent in counseling/coordination of care: YES  POSSIBLE D/C IN 2 DAYS, DEPENDING ON CLINICAL CONDITION.   [name] M.D on 01/17/2017 at 10:30 AM  Between 7am to 6pm - Pager - [phone]  After 6pm go to [email or web address] - Social research officer, government  [facility]  [facility]ospital[facility]  Office  [phone]  CC: Primary care physician; BABAOFF, Lavada Mesi, MD  Note: This dictation was prepared with Dragon dictation along with smaller phrase technology. Any transcriptional errors that result from this process are unintentional.

## 2017-01-18 ENCOUNTER — Encounter: Payer: Self-pay | Admitting: Radiology

## 2017-01-18 ENCOUNTER — Inpatient Hospital Stay: Payer: Medicare Other

## 2017-01-18 LAB — MAGNESIUM: Magnesium: 1.9 mg/dL (ref 1.7–2.4)

## 2017-01-18 LAB — BASIC METABOLIC PANEL
ANION GAP: 6 (ref 5–15)
BUN: 38 mg/dL — ABNORMAL HIGH (ref 6–20)
CALCIUM: 9.1 mg/dL (ref 8.9–10.3)
CO2: 36 mmol/L — ABNORMAL HIGH (ref 22–32)
Chloride: 99 mmol/L — ABNORMAL LOW (ref 101–111)
Creatinine, Ser: 1.39 mg/dL — ABNORMAL HIGH (ref 0.44–1.00)
GFR, EST AFRICAN AMERICAN: 38 mL/min — AB (ref >60)
GFR, EST NON AFRICAN AMERICAN: 33 mL/min — AB (ref >60)
Glucose, Bld: 100 mg/dL — ABNORMAL HIGH (ref 65–99)
Potassium: 3.7 mmol/L (ref 3.5–5.1)
SODIUM: 141 mmol/L (ref 135–145)

## 2017-01-18 MED ORDER — FUROSEMIDE 20 MG PO TABS
20.0000 mg | ORAL_TABLET | Freq: Two times a day (BID) | ORAL | Status: DC
  Administered 2017-01-18: 20 mg via ORAL
  Filled 2017-01-18: qty 1

## 2017-01-18 MED ORDER — TECHNETIUM TC 99M DIETHYLENETRIAME-PENTAACETIC ACID
30.0000 | Freq: Once | INTRAVENOUS | Status: AC | PRN
  Administered 2017-01-18: 32.596 via RESPIRATORY_TRACT

## 2017-01-18 MED ORDER — TECHNETIUM TO 99M ALBUMIN AGGREGATED
4.0000 | Freq: Once | INTRAVENOUS | Status: AC | PRN
  Administered 2017-01-18: 4.108 via INTRAVENOUS

## 2017-01-18 NOTE — Care Management (Signed)
This RNCM received call transfer from patient's acclaimed HCPOA Tyron RussellBrenda Swallows (DIL) [phone]. Per [name] she will bring in copy of HCPOA- she states that "she was told by ED physician and hospitalist that patient should have been inpatient". This RNCM attempted to explain Medicare requirements without sharing too much medical information with [name] as I did not know if she was truly HCPOA. "Son at hospital now" per [place]. She was pretty frustrated about status OBServation. I have asked RNCM covering that unit to also review status and speak with son or [name] if appropriate due to privacy concerns.

## 2017-01-18 NOTE — Clinical Social Work Note (Signed)
CSW received referral for SNF.  Case discussed with case manager and plan is to discharge home with home health.  CSW to sign off please re-consult if social work needs arise.  Rifka Ramey R. Letrell Attwood, MSW, LCSWA [phone]  

## 2017-01-18 NOTE — Progress Notes (Signed)
Physical Therapy Treatment Patient Details Name: [name] MRN: [id number] DOB: Mar 06, 1926 Today's Date: 01/18/2017    History of Present Illness Patient is an 83 y.o. female admitted on 23 FEB from PCP due to 20# weight gain since DEC and SOB; patient reportedly has home O2 but does not use it. Patient was at PCP to get injections in R knee. PMH includes brain aneurysm that impacts short-term memory and pancreatitis.     PT Comments    Pt is agreeable to working with therapy on this date. She denies pain at this time and is eager to get stronger. With ambulation pt is only able to walk 5' forward and backwards due to LE weakness and fear of LE buckling. SaO2 drops to 87% on 2L/min O2 and requires extended time to recover with seated rest break. She is unable to ambulate farther at this time. Pt completes all seated exercises with therapist as instructed. Discussed that she needs SNF placement at discharge in order to improve strength and safety. Pt would be limited in her mobility if she attempted to return home and is a high risk for falls even with support from family. Pt is unsure whether she is open to this recommendation or not. Spoke with daughter-in-law [name] who had already expressed to CSW that she would not consider SNF. After discussing concerns with [name] she agreed that initiating a bed search and considering SNF is a reasonable recommendation. She has had a bad experience with SNF placement in the past but agrees that it may be necessary for patient. She would still like to discuss the overall plan of care for patient with the medical staff. Left message with CSW updating her regarding discussion with daughter-in-law. We will continue to work with patient toward the goal of returning home with [facility] if possible. Pt will benefit from skilled PT services to address deficits in strength, balance, and mobility in order to return to full function at home.    Follow Up Recommendations  SNF     Equipment Recommendations  None recommended by PT;Other (comment) (Recommended using RW at all times)    Recommendations for Other Services       Precautions / Restrictions Precautions Precautions: Fall Restrictions Weight Bearing Restrictions: No    Mobility  Bed Mobility Overal bed mobility: Needs Assistance Bed Mobility: Supine to Sit;Sit to Supine     Supine to sit: Mod assist Sit to supine: Min assist   General bed mobility comments: Pt requires modA+1 for UE support when pulling from R sidelying into sitting. She is able to roll onto her R side independently. She requires minA+1 to assist bilateral LE when returning to bed  Transfers Overall transfer level: Needs assistance Equipment used: Rolling walker (2 wheeled);Straight cane Transfers: Sit to/from Stand Sit to Stand: Min assist         General transfer comment: Pt requires minA+2 initially progressing to minA+1 with repetition. Pt unable to push up from seated surface and insteady has to pull up on walker. Increased time required. Decreased LE strength evident during transfers. Performed sit to stand transfers to/from bed as well as BSC  Ambulation/Gait Ambulation/Gait assistance: Min assist;+2 physical assistance Ambulation Distance (Feet): 10 Feet Assistive device: Rolling walker (2 wheeled) Gait Pattern/deviations: Decreased step length - right;Decreased step length - left Gait velocity: Decreased Gait velocity interpretation: <1.8 ft/sec, indicative of risk for recurrent falls General Gait Details: Pt notably weaker today than during last PT session. She is able to  ambulate approximately 5' forward and then backwards with rolling walker. Pt reports bilateral LE weakness and appears to be very weak and at risk for LE buckling. Elected not to ambulate further at this time for safety. Pt agrees that she is unsafe to ambulate further at this time. SaO2 drops to 87% on 2L/min O2 and requires extended time  to recover after returning to sitting   Stairs            Wheelchair Mobility    Modified Rankin (Stroke Patients Only)       Balance Overall balance assessment: Needs assistance;History of Falls Sitting-balance support: Feet supported Sitting balance-Leahy Scale: Good     Standing balance support: Bilateral upper extremity supported Standing balance-Leahy Scale: Fair                      Cognition Arousal/Alertness: Awake/alert Behavior During Therapy: WFL for tasks assessed/performed Overall Cognitive Status: History of cognitive impairments - at baseline                      Exercises General Exercises - Lower Extremity Long Arc Quad: Strengthening;Both;10 reps;Seated Heel Slides: Strengthening;Both;10 reps;Seated Hip ABduction/ADduction: Strengthening;Both;10 reps;Seated Hip Flexion/Marching: Strengthening;Both;10 reps;Seated Heel Raises: Strengthening;Both;10 reps;Seated    General Comments        Pertinent Vitals/Pain Pain Assessment: No/denies pain    Home Living                      Prior Function            PT Goals (current goals can now be found in the care plan section) Acute Rehab PT Goals Patient Stated Goal: Unstated PT Goal Formulation: With patient/family Time For Goal Achievement: 01/30/17 Potential to Achieve Goals: Good Progress towards PT goals: Not progressing toward goals - comment (Pt weaker today compared to prior session)    Frequency    Min 2X/week      PT Plan Discharge plan needs to be updated    Co-evaluation             End of Session Equipment Utilized During Treatment: Gait belt;Oxygen;Other (comment) (2 L/min) Activity Tolerance: Patient limited by fatigue Patient left: in bed;with call bell/phone within reach;with bed alarm set Nurse Communication: Mobility status PT Visit Diagnosis: Unsteadiness on feet (R26.81);History of falling (Z91.81);Difficulty in walking, not  elsewhere classified (R26.2);Muscle weakness (generalized) (M62.81)     Time: [id number] PT Time Calculation (min) (ACUTE ONLY): 26 min  Charges:  $Gait Training: 8-22 mins $Therapeutic Exercise: 8-22 mins                    G Codes:      Sharalyn InkJason D Sephora Boyar PT, DPT   Dorine Duffey 01/18/2017, 3:49 PM

## 2017-01-18 NOTE — Care Management (Signed)
Patient initially placed in observation for new onset congestive heart failure.  She was sent from her PCP office. It is documented in ED triage note that patient does not have home oxygen but per patient's daughter in lawSteward Drone- Brenda- , patient has home oxygen but does not wear it. If it comes off, she forgets to put it back on.  She has portable tanks to push or pull around and there is concern that she will trip over it.  Says that has had discussion with patient's pcp and it has been suggested that need to weigh the risk of fall due to 02 tubing and it may be better that she does not use the oxygen due to the tubing.  Discussed the possibility of portable 02 concentrator.  [name] has many questions about why this patient was placed in observation and not admitted.  CM discussed criteria and screening of the criteria.  She says she called medicare and was informed patient should be admitted.  [name] wishes to speak with those involved in the review process.  Sent notice to UR.  Physical therapy is recommending skilled nursing placement.  [name] is adamantly opposed to this recommendation.  Would be agreeable to home health.  No agency preference

## 2017-01-18 NOTE — Progress Notes (Signed)
[employer] - [place] at [facility]   PATIENT NAME: [name]    MR#:  [id number]  DATE OF BIRTH:  September 19, 1926  SUBJECTIVE:  CHIEF COMPLAINT:   Chief Complaint  Patient presents with  . Weight Gain   Weakness, Shortness of breath and leg swelling. On oxygen [place] 3 L. REVIEW OF SYSTEMS:  Review of Systems  Constitutional: Positive for malaise/fatigue. Negative for chills and fever.  HENT: Negative for congestion and sore throat.   Eyes: Negative for blurred vision and double vision.  Respiratory: Positive for cough and shortness of breath. Negative for hemoptysis, sputum production, wheezing and stridor.   Cardiovascular: Negative for chest pain and leg swelling.  Gastrointestinal: Negative for abdominal pain, blood in stool, constipation, diarrhea, melena, nausea and vomiting.  Genitourinary: Negative for dysuria and hematuria.  Musculoskeletal: Negative for joint pain.  Skin: Negative for itching and rash.  Neurological: Positive for weakness. Negative for dizziness, focal weakness and loss of consciousness.  Psychiatric/Behavioral: Negative for depression. The patient is not nervous/anxious.     DRUG ALLERGIES:  No Known Allergies VITALS:  Blood pressure 109/66, pulse 84, temperature 98 F (36.7 C), resp. rate 19, height 4\' 11"  (1.499 m), weight 170 lb (77.1 kg), SpO2 96 %. PHYSICAL EXAMINATION:  Physical Exam  Constitutional: She is oriented to person, place, and time and well-developed, well-nourished, and in no distress.  HENT:  Head: Normocephalic.  Mouth/Throat: Oropharynx is clear and moist.  Eyes: Conjunctivae and EOM are normal. No scleral icterus.  Neck: Normal range of motion. Neck supple. No JVD present. No tracheal deviation present.  Cardiovascular: Normal rate, regular rhythm and normal heart sounds.  Exam reveals no gallop.   No murmur heard. Pulmonary/Chest: Effort normal. No respiratory distress. She has no wheezes. She has rales. She  exhibits no tenderness.  Abdominal: Soft. Bowel sounds are normal. She exhibits no distension. There is no tenderness. There is no rebound.  Musculoskeletal: Normal range of motion. She exhibits edema. She exhibits no tenderness.  Neurological: She is alert and oriented to person, place, and time. No cranial nerve deficit.  Skin: No rash noted. No erythema.  Psychiatric: Affect normal.   LABORATORY PANEL:  Female CBC  Recent Labs Lab 01/15/17 1729  WBC 6.4  HGB 16.0  HCT 49.6*  PLT 167   ------------------------------------------------------------------------------------------------------------------ Chemistries   Recent Labs Lab 01/18/17 0631  NA 141  K 3.7  CL 99*  CO2 36*  GLUCOSE 100*  BUN 38*  CREATININE 1.39*  CALCIUM 9.1  MG 1.9   RADIOLOGY:  Dg Chest 2 View  Result Date: 01/18/2017 CLINICAL DATA:  Deep venous thrombosis. For comparison with VQ scan. EXAM: CHEST  2 VIEW COMPARISON:  01/15/2017 FINDINGS: Very low lung volumes. Elevation of the left hemidiaphragm. Cardiomegaly with vascular congestion and bibasilar atelectasis. No effusions or acute bony abnormality. IMPRESSION: Very low lung volumes with bibasilar atelectasis and vascular congestion. Electronically Signed   By: Charlett Nose M.D.   On: 01/18/2017 11:08   Nm Pulmonary Perf And Vent  Result Date: 01/18/2017 CLINICAL DATA:  Shortness of Breath EXAM: NUCLEAR MEDICINE VENTILATION - PERFUSION LUNG SCAN VIEWS: Anterior, posterior, left lateral, right lateral, RPO, LPO, RAO, LAO -ventilation and perfusion RADIOPHARMACEUTICALS:  32.596 mCi Technetium-67m DTPA aerosol inhalation and 4.108 mCi Technetium-76m MAA IV COMPARISON:  Chest radiograph January 18, 2017 FINDINGS: Ventilation: There is relatively diminished ventilation throughout the left lower lobe most notable on anterior and posterior images. There are patchy subsegmental ventilation defects  elsewhere. Perfusion: There is somewhat diminished perfusion  involving much of the left lower lobe, matching the left lower lobe defect. Elsewhere, there are matching subsegmental perfusion defects with respect to the ventilation defects. No significant ventilation/perfusion mismatch is appreciable on this study. Note that there is marked elevation of the left hemidiaphragm which may contribute to the photopenia on both the ventilation and perfusion studies. Note that on oblique images, there is relatively better visualization of left lower lobe uptake on both the ventilation and perfusion studies in a matching distribution. IMPRESSION: Relatively diminished ventilation and profusion in a matching distribution involving the left lower lobe, felt to be due largely to elevation of the left hemidiaphragm. On oblique views, the left lower lobe regions show only modest matching ventilation and perfusion defects. There is no significant ventilation/ perfusion mismatch on this study. This study overall constitutes a relatively low probability of pulmonary embolus. Electronically Signed   By: Bretta BangWilliam  Woodruff III M.D.   On: 01/18/2017 12:34   ASSESSMENT AND PLAN:   This is a 83 y.o. female with a history of brain aneurysm, pancreatitis, GERD, allergies being admitted with:  1.  New onset acute diastolic congestive heart failure Continue lasix iv bid, Coreg, hold lisinopril due to low BP. Hold if SBP<100 or DBP<60 Echo: EF 60%. Per Dr. Lady GaryFath, proceed with diuresis with iv lasix.  Acute respiratory failure with hypoxia due to above, still on O2 [place] 3L. Try to wean down oxygen, continue Neb therapy as needed  2. ARF on CKD stage 3 Stable. - Avoid nephrotoxic medications   3. history of GERD -Continue Prilosec  4. History of allergies -Continue cetirizine   Hypomagnesemia. Improved with IV mag.  History of Intracranial bleeding due to brain aneurysm, the daughter has concern and does not want heparin SQ.  Left leg chronic DVT. VQ scan is unremarkable. No  anticoagulation due to history of Intracranial bleeding due to brain aneurysm.  Generalized weakness. PT evaluation suggested home health and PT.  All the records are reviewed and case discussed with Care Management/Social Worker. Management plans discussed with the patient, her son and they are in agreement.  CODE STATUS: DNR  TOTAL TIME TAKING CARE OF THIS PATIENT: 38 minutes.   More than 50% of the time was spent in counseling/coordination of care: YES  POSSIBLE D/C IN 2 DAYS, DEPENDING ON CLINICAL CONDITION.   Shaune Pollackhen, Marionette Meskill M.D on 01/18/2017 at 2:15 PM  Between 7am to 6pm - Pager - [phone]  After 6pm go to [email or web address] - Social research officer, governmentpassword EPAS [facility]  [facility] [place] [facility]  Office  [phone]  CC: Primary care physician; BABAOFF, Lavada MesiMARC E, MD  Note: This dictation was prepared with Dragon dictation along with smaller phrase technology. Any transcriptional errors that result from this process are unintentional.

## 2017-01-18 NOTE — [place] FL2 (Signed)
[place] MEDICAID FL2 LEVEL OF CARE SCREENING TOOL     IDENTIFICATION  Patient Name: [name] Birthdate: 08/30/1926 Sex: female Admission Date (Current Location): 01/15/2017  [place]   Facility and Address:  [facility], [address], [place], [place] [address]      Provider Number: [id number]  Attending Physician Name and Address:  Shaune PollackQing Chen, MD  Relative Name and Phone Number:  Donah DriverMurphy,Richard D Son [phone] or Selinda FlavinMurphy,Brenda E Relative [phone]     Current Level of Care: Hospital Recommended Level of Care: Skilled Nursing Facility Prior Approval Number:    Date Approved/Denied:   PASRR Number: [id number]  Discharge Plan: SNF    Current Diagnoses: Patient Active Problem List   Diagnosis Date Noted  . Heart failure (HCC) 01/17/2017  . Congestive heart failure (HCC) 01/15/2017    Orientation RESPIRATION BLADDER Height & Weight     Self, Place  Normal Continent Weight: 170 lb (77.1 kg) Height:  4\' 11"  (149.9 cm)  BEHAVIORAL SYMPTOMS/MOOD NEUROLOGICAL BOWEL NUTRITION STATUS      Continent Diet (Cardiac diet)  AMBULATORY STATUS COMMUNICATION OF NEEDS Skin   Limited Assist Verbally Normal                       Personal Care Assistance Level of Assistance  Bathing, Feeding, Dressing Bathing Assistance: Limited assistance Feeding assistance: Independent Dressing Assistance: Limited assistance     Functional Limitations Info  Sight, Hearing, Speech Sight Info: Adequate Hearing Info: Adequate Speech Info: Adequate    SPECIAL CARE FACTORS FREQUENCY  PT (By licensed PT)     PT Frequency: 5x a week              Contractures Contractures Info: Not present    Additional Factors Info  Code Status, Allergies Code Status Info: DNR Allergies Info: NKA           Current Medications (01/18/2017):  This is the current hospital active medication list Current Facility-Administered  Medications  Medication Dose Route Frequency Provider Last Rate Last Dose  . 0.9 %  sodium chloride infusion  250 mL Intravenous PRN Alexis Hugelmeyer, DO      . acetaminophen (TYLENOL) tablet 650 mg  650 mg Oral Q4H PRN Alexis Hugelmeyer, DO   650 mg at 01/16/17 0111  . aspirin EC tablet 81 mg  81 mg Oral Daily Alexis Hugelmeyer, DO   81 mg at 01/18/17 1000  . carvedilol (COREG) tablet 3.125 mg  3.125 mg Oral BID WC Shaune PollackQing Chen, MD   3.125 mg at 01/18/17 0800  . cholecalciferol (VITAMIN D) tablet 1,000 Units  1,000 Units Oral Daily Alexis Hugelmeyer, DO   1,000 Units at 01/18/17 1000  . furosemide (LASIX) tablet 20 mg  20 mg Oral BID Shaune PollackQing Chen, MD      . loratadine (CLARITIN) tablet 10 mg  10 mg Oral Daily Alexis Hugelmeyer, DO   10 mg at 01/18/17 1000  . multivitamin-lutein (OCUVITE-LUTEIN) capsule 1 capsule  1 capsule Oral BID Alexis Hugelmeyer, DO   1 capsule at 01/18/17 1000  . ondansetron (ZOFRAN) injection 4 mg  4 mg Intravenous Q6H PRN Alexis Hugelmeyer, DO      . pantoprazole (PROTONIX) EC tablet 40 mg  40 mg Oral Daily Alexis Hugelmeyer, DO   40 mg at 01/18/17 1000  . sodium chloride flush (NS) 0.9 % injection 3 mL  3 mL Intravenous Q12H Alexis Hugelmeyer, DO   3 mL  at 01/18/17 1000  . sodium chloride flush (NS) 0.9 % injection 3 mL  3 mL Intravenous PRN Alexis Hugelmeyer, DO         Discharge Medications: Please see discharge summary for a list of discharge medications.  Relevant Imaging Results:  Relevant Lab Results:   Additional Information SSN [id number]  Darleene Cleaver, [place]

## 2017-01-19 LAB — BASIC METABOLIC PANEL
Anion gap: 9 (ref 5–15)
BUN: 45 mg/dL — AB (ref 6–20)
CALCIUM: 9.4 mg/dL (ref 8.9–10.3)
CO2: 30 mmol/L (ref 22–32)
CREATININE: 1.66 mg/dL — AB (ref 0.44–1.00)
Chloride: 102 mmol/L (ref 101–111)
GFR calc Af Amer: 31 mL/min — ABNORMAL LOW (ref >60)
GFR, EST NON AFRICAN AMERICAN: 26 mL/min — AB (ref >60)
Glucose, Bld: 145 mg/dL — ABNORMAL HIGH (ref 65–99)
Potassium: 4.4 mmol/L (ref 3.5–5.1)
SODIUM: 141 mmol/L (ref 135–145)

## 2017-01-19 MED ORDER — SODIUM CHLORIDE 0.9 % IV SOLN
INTRAVENOUS | Status: DC
  Administered 2017-01-19 – 2017-01-20 (×3): via INTRAVENOUS

## 2017-01-19 MED ORDER — FUROSEMIDE 20 MG PO TABS: 20.0000 mg | ORAL_TABLET | Freq: Every day | ORAL | Status: DC

## 2017-01-19 NOTE — Progress Notes (Signed)
[facility]  SUBJECTIVE: no chest pain or sob   Vitals:   01/18/17 0429 01/18/17 2014 01/19/17 0500 01/19/17 0532  BP: 109/66 (!) 85/56  (!) 87/50  Pulse: 84 79  81  Resp: 19 16  16   Temp: 98 F (36.7 C) 97.8 F (36.6 C)  97.9 F (36.6 C)  TempSrc:      SpO2: 96% 91%  91%  Weight: 77.1 kg (170 lb)  76.9 kg (169 lb 8 oz)   Height:        Intake/Output Summary (Last 24 hours) at 01/19/17 [id number] Last data filed at 01/19/17 0533  Gross per 24 hour  Intake              240 ml  Output              450 ml  Net             -210 ml    LABS: Basic Metabolic Panel:  Recent Labs  29/56/2102/25/18 0504 01/18/17 0631 01/19/17 0348  NA 142 141 141  K 3.7 3.7 4.4  CL 100* 99* 102  CO2 35* 36* 30  GLUCOSE 101* 100* 145*  BUN 39* 38* 45*  CREATININE 1.42* 1.39* 1.66*  CALCIUM 9.2 9.1 9.4  MG 1.6* 1.9  --    Liver Function Tests: No results for input(s): AST, ALT, ALKPHOS, BILITOT, PROT, ALBUMIN in the last 72 hours. No results for input(s): LIPASE, AMYLASE in the last 72 hours. CBC: No results for input(s): WBC, NEUTROABS, HGB, HCT, MCV, PLT in the last 72 hours. Cardiac Enzymes:  Recent Labs  01/16/17 1049  TROPONINI 0.03*   BNP: Invalid input(s): POCBNP D-Dimer: No results for input(s): DDIMER in the last 72 hours. Hemoglobin A1C: No results for input(s): HGBA1C in the last 72 hours. Fasting Lipid Panel: No results for input(s): CHOL, HDL, LDLCALC, TRIG, CHOLHDL, LDLDIRECT in the last 72 hours. Thyroid Function Tests: No results for input(s): TSH, T4TOTAL, T3FREE, THYROIDAB in the last 72 hours.  Invalid input(s): FREET3 Anemia Panel: No results for input(s): VITAMINB12, FOLATE, FERRITIN, TIBC, IRON, RETICCTPCT in the last 72 hours.   Physical Exam: Blood pressure (!) 87/50, pulse 81, temperature 97.9 F (36.6 C), resp. rate 16, height 4\' 11"  (1.499 m), weight 76.9 kg (169 lb 8 oz), SpO2 91 %.   Wt Readings from Last 1  Encounters:  01/19/17 76.9 kg (169 lb 8 oz)     General appearance: cooperative Resp: clear to auscultation bilaterally Cardio: regular rate and rhythm GI: soft, non-tender; bowel sounds normal; no masses,  no organomegaly Extremities: edema 1-2+ edema Neurologic: Grossly normal  TELEMETRY: Reviewed telemetry pt in sinus rhythm  ASSESSMENT AND PLAN:  Active Problems:   Congestive heart failure (HCC)-cxr improved but still showing some vascular congestion. No frank pulmonary edema. Renal function decreased and blood pressure down somewhat so will hold lasix and carvedilol and gently hyydrate.    Dalia HeadingKenneth A Cullan Launer, MD, [facility] 01/19/2017 7:02 AM

## 2017-01-19 NOTE — Care Management (Signed)
Informed that patient's daughter who was going to consider skilled nursing placement, has decided against it and wishes to pursue home health.  she has inquired about a bedside commode. Informed her it is likely that it would not be covered by medicare but equipment cold be provided.  [name] declines.  Obtained order for in home assessment for portable concentrator.  Current 02 is provided by [facility].

## 2017-01-19 NOTE — Clinical Social Work Note (Signed)
CSW spoke to patient and she said to contact [name] the daughter in law to discuss discharge planning. CSW contacted patient's daughter in law [name] [phone] to discuss SNF placement verse Home Health.  Patient's daughter in law says that patient's grandaughter will be able to stay with patient and help her with therapy at home.  Patient's daughter-in-law said patient has been to rehab in the past and they did not have a good experience.  Patient's daughter in law is requesting home health instead, CSW updated case manager of patient and daughter-in-law's wishes.  CSW to sign off please reconsult if other social work needs arise.  Ervin KnackEric R. Brentley Landfair, MSW, Theresia MajorsLCSWA [phone]  01/19/2017 5:08 PM

## 2017-01-19 NOTE — Progress Notes (Signed)
[facility] [place] [facility]   PATIENT NAME: [name]    MR#:  [id number]  DATE OF BIRTH:  01/15/1926  SUBJECTIVE:  CHIEF COMPLAINT:   Chief Complaint  Patient presents with  . Weight Gain   Weakness, no shortness of breath or leg swelling. On oxygen [employer] 2 L. REVIEW OF SYSTEMS:  Review of Systems  Constitutional: Positive for malaise/fatigue. Negative for chills and fever.  HENT: Negative for congestion and sore throat.   Eyes: Negative for blurred vision and double vision.  Respiratory: Negative for cough, hemoptysis, sputum production, shortness of breath, wheezing and stridor.   Cardiovascular: Negative for chest pain and leg swelling.  Gastrointestinal: Negative for abdominal pain, blood in stool, constipation, diarrhea, melena, nausea and vomiting.  Genitourinary: Negative for dysuria and hematuria.  Musculoskeletal: Negative for joint pain.  Skin: Negative for itching and rash.  Neurological: Positive for weakness. Negative for dizziness, focal weakness and loss of consciousness.  Psychiatric/Behavioral: Negative for depression. The patient is not nervous/anxious.     DRUG ALLERGIES:  No Known Allergies VITALS:  Blood pressure (!) 87/50, pulse 81, temperature 97.9 F (36.6 C), resp. rate 16, height 4\' 11"  (1.499 m), weight 169 lb 8 oz (76.9 kg), SpO2 91 %. PHYSICAL EXAMINATION:  Physical Exam  Constitutional: She is oriented to person, place, and time and well-developed, well-nourished, and in no distress.  HENT:  Head: Normocephalic.  Mouth/Throat: Oropharynx is clear and moist.  Eyes: Conjunctivae and EOM are normal. No scleral icterus.  Neck: Normal range of motion. Neck supple. No JVD present. No tracheal deviation present.  Cardiovascular: Normal rate, regular rhythm and normal heart sounds.  Exam reveals no gallop.   No murmur heard. Pulmonary/Chest: Effort normal. No respiratory distress. She has no wheezes. She has rales. She  exhibits no tenderness.  Abdominal: Soft. Bowel sounds are normal. She exhibits no distension. There is no tenderness. There is no rebound.  Musculoskeletal: Normal range of motion. She exhibits no edema or tenderness.  Neurological: She is alert and oriented to person, place, and time. No cranial nerve deficit.  Skin: No rash noted. No erythema.  Psychiatric: Affect normal.   LABORATORY PANEL:  Female CBC  Recent Labs Lab 01/15/17 1729  WBC 6.4  HGB 16.0  HCT 49.6*  PLT 167   ------------------------------------------------------------------------------------------------------------------ Chemistries   Recent Labs Lab 01/18/17 0631 01/19/17 0348  NA 141 141  K 3.7 4.4  CL 99* 102  CO2 36* 30  GLUCOSE 100* 145*  BUN 38* 45*  CREATININE 1.39* 1.66*  CALCIUM 9.1 9.4  MG 1.9  --    RADIOLOGY:  No results found. ASSESSMENT AND PLAN:   This is a 83 y.o. female with a history of brain aneurysm, pancreatitis, GERD, allergies being admitted with:  1.  New onset acute diastolic congestive heart failure discontinue lasix iv bid due to worsening renal funtion, continue Coreg, Hold if SBP<100 or DBP<60,  hold lisinopril due to low BP.  Echo: EF 60%. Per Dr. Lady GaryFath, hold iv lasix, NS iv   Acute respiratory failure with hypoxia due to above,  Try to wean off O2 [place].  continue Neb therapy as needed  2. ARF on CKD stage 3 Worsening, Per Dr. Lady GaryFath, hold iv lasix, NS iv, f/u BMP. - Avoid nephrotoxic medications   3. history of GERD -Continue Prilosec  4. History of allergies -Continue cetirizine   Hypomagnesemia. Improved with IV mag.  History of Intracranial bleeding due to brain aneurysm,  the daughter has concern and does not want heparin SQ.  Left leg chronic DVT. VQ scan is unremarkable. No anticoagulation due to history of Intracranial bleeding due to brain aneurysm.  Generalized weakness. PT evaluation suggested SNF.  All the records are reviewed and case  discussed with Care Management/Social Worker. Management plans discussed with the patient, her son and they are in agreement.  CODE STATUS: DNR  TOTAL TIME TAKING CARE OF THIS PATIENT: 33 minutes.   More than 50% of the time was spent in counseling/coordination of care: YES  POSSIBLE D/C IN 1-2 DAYS, DEPENDING ON CLINICAL CONDITION.   [name] M.D on 01/19/2017 at 12:26 PM  Between 7am to 6pm - Pager - [phone]  After 6pm go to [email or web address] - Social research officer, government  [facility] [place] [facility]  Office  [phone]  CC: Primary care physician; BABAOFF, Lavada Mesi, MD  Note: This dictation was prepared with Dragon dictation along with smaller phrase technology. Any transcriptional errors that result from this process are unintentional.

## 2017-01-19 NOTE — Progress Notes (Signed)
Physical Therapy Treatment Patient Details Name: [name] MRN: [id number] DOB: 1926-08-30 Today's Date: 01/19/2017    History of Present Illness Patient is an 83 y.o. female admitted on 23 FEB from PCP due to 20# weight gain since DEC and SOB; patient reportedly has home O2 but does not use it. Patient was at PCP to get injections in R knee. PMH includes brain aneurysm that impacts short-term memory and pancreatitis.     PT Comments    Pt awake and willing to participate in PT session. She was confused throughout session and disoriented to time and place, unable to say what year it is or which hospital she is in. Blood pressure was 86/52 supine at rest but increased to 101/69 in sitting w/ no signs of orthostatic hypotension. Pt displays general weakness and requires min assist for bed mobility and transfers. Pt ambulated further then previous session w/ Rw, min assist and a chair follow for safety. Pt displayed decrease safety awareness and required frequent cuing for safe use of RW and occasional min assist to prevent her R knee from buckling in stance phase. Ambulation was terminated due to drop in O2 levels to 87% and patient was safely transferred to chair and wheeled back to her room where O2 levels returned to 92% on 2L/min. Pt appears to be progressing but still limited in safe mobility and requires supervision and assistance during functional tasks due to decreased strength, and safety awareness as well as current cardiopulmonary status. Pt will continue to benefit from skilled PT and recommend dc to STR following acute hospital stay.    Follow Up Recommendations  SNF     Equipment Recommendations  Rolling walker with 5" wheels    Recommendations for Other Services       Precautions / Restrictions Precautions Precautions: Fall Restrictions Weight Bearing Restrictions: No    Mobility  Bed Mobility Overal bed mobility: Needs Assistance Bed Mobility: Supine to Sit      Supine to sit: Min assist;HOB elevated     General bed mobility comments: Min assist and cuing to advance LE and bring trunk into upright position, requires increased time and elevated HOB to perform bed mobility  Transfers Overall transfer level: Needs assistance Equipment used: Rolling walker (2 wheeled) Transfers: Sit to/from Stand Sit to Stand: Min assist         General transfer comment: Cuing for foot and hand placement w/ RW, able to transfer to standing w/ increase time to straighten knees, complains of increase in R knee pain in standing  Ambulation/Gait Ambulation/Gait assistance: Min assist;+2 safety/equipment Ambulation Distance (Feet): 90 Feet Assistive device: Rolling walker (2 wheeled) Gait Pattern/deviations: Step-through pattern;Decreased step length - left;Decreased stance time - right;Antalgic;Trunk flexed   Gait velocity interpretation: Below normal speed for age/gender General Gait Details: able to ambulate half way around nursing station, decreased step length on L due to R knee pain in standing, minor buckling of R knee required min assist to correct, requires frequent cuing for safe use of RW, chair follow for safety, terminated ambulation due to O2 sat at 87% on 2 L/min   Stairs            Wheelchair Mobility    Modified Rankin (Stroke Patients Only)       Balance Overall balance assessment: Needs assistance;History of Falls Sitting-balance support: Feet supported;No upper extremity supported Sitting balance-Leahy Scale: Good Sitting balance - Comments: able to sit at EOB w/o back support and maintain upright posture under  PT superivision   Standing balance support: Bilateral upper extremity supported Standing balance-Leahy Scale: Fair Standing balance comment: requires use of RW for incresed stability,                     Cognition Arousal/Alertness: Awake/alert Behavior During Therapy: WFL for tasks assessed/performed Overall  Cognitive Status: No family/caregiver present to determine baseline cognitive functioning                 General Comments: Disoriented to time and place, difficulty answering questions and easily distracted     Exercises Other Exercises Other Exercises: transfer training from sit to stand and stand to sit x4, using RW and min assist able to progress to min guarding, cuing for hand placement,    General Comments        Pertinent Vitals/Pain Pain Assessment: No/denies pain    Home Living                      Prior Function            PT Goals (current goals can now be found in the care plan section) Acute Rehab PT Goals Patient Stated Goal: Return home PT Goal Formulation: With patient Time For Goal Achievement: 01/30/17 Potential to Achieve Goals: Good Progress towards PT goals: Progressing toward goals    Frequency    Min 2X/week      PT Plan Current plan remains appropriate    Co-evaluation             End of Session Equipment Utilized During Treatment: Gait belt;Oxygen Activity Tolerance: Patient tolerated treatment well Patient left: in chair;with call bell/phone within reach;with chair alarm set Nurse Communication: Mobility status PT Visit Diagnosis: Unsteadiness on feet (R26.81);History of falling (Z91.81);Difficulty in walking, not elsewhere classified (R26.2);Muscle weakness (generalized) (M62.81)     Time: 8469-62951146-1229 PT Time Calculation (min) (ACUTE ONLY): 43 min  Charges:                       G Codes:       [employer] Aryel Edelen Student PT 01/19/2017, 12:50 PM

## 2017-01-19 NOTE — Care Management Important Message (Signed)
Important Message  Patient Details  Name: [name] MRN: [id number] Date of Birth: 12/15/1925   Medicare Important Message Given:  Yes Initial signed IM printed from Epic and given to patient.    Eber HongGreene, Chabely Norby R, RN 01/19/2017, 12:02 PM

## 2017-01-19 NOTE — Discharge Instructions (Signed)
Heart Failure Clinic appointment on January 29, 2017 at 10:00am with Clarisa Kindredina Levelle Edelen, FNP. Please call [phone] to reschedule.

## 2017-01-19 NOTE — Progress Notes (Signed)
Initial HF Clinic appointment scheduled for January 29, 2017 at 10:00am. Thank you.

## 2017-01-20 LAB — BASIC METABOLIC PANEL
ANION GAP: 8 (ref 5–15)
BUN: 53 mg/dL — AB (ref 6–20)
CALCIUM: 9.3 mg/dL (ref 8.9–10.3)
CO2: 34 mmol/L — ABNORMAL HIGH (ref 22–32)
Chloride: 100 mmol/L — ABNORMAL LOW (ref 101–111)
Creatinine, Ser: 1.79 mg/dL — ABNORMAL HIGH (ref 0.44–1.00)
GFR calc Af Amer: 28 mL/min — ABNORMAL LOW (ref >60)
GFR, EST NON AFRICAN AMERICAN: 24 mL/min — AB (ref >60)
GLUCOSE: 121 mg/dL — AB (ref 65–99)
Potassium: 4 mmol/L (ref 3.5–5.1)
Sodium: 142 mmol/L (ref 135–145)

## 2017-01-20 NOTE — Progress Notes (Signed)
Physical Therapy Treatment Patient Details Name: [name] MRN: [id number] DOB: 09/24/1926 Today's Date: 01/20/2017    History of Present Illness Patient is an 83 y.o. female admitted on 23 FEB from PCP due to 20# weight gain since DEC and SOB; patient reportedly has home O2 but does not use it. Patient was at PCP to get injections in R knee. PMH includes brain aneurysm that impacts short-term memory and pancreatitis.     PT Comments    Pt ambulated 60' this session with CGA and RW with antalgic gait on R but no buckling or assistance required to prevent LOB.  Pt limited to 6360' of amb secondary to nursing reporting a telemetry lead needed changed and requested pt be returned to sitting.  Pt on 1.5LO2/min with SpO2 91-93% throughout session without SOB.  Pt required no physical assistance for bed mobility this session and only CGA with transfers.  Pt required extra time and effort for these tasks, however, and will benefit from continued PT services to address deficits in strength, gait, mobility, balance, and activity tolerance.      Follow Up Recommendations  Home health PT;Supervision/Assistance - 24 hour     Equipment Recommendations  Rolling walker with 5" wheels    Recommendations for Other Services       Precautions / Restrictions Precautions Precautions: Fall Restrictions Weight Bearing Restrictions: No    Mobility  Bed Mobility Overal bed mobility: Needs Assistance Bed Mobility: Supine to Sit;Sit to Supine     Supine to sit: Supervision Sit to supine: Supervision   General bed mobility comments: Pt required extra time and effort and use of bed rail with sup to/from sit but no physical assistance required  Transfers Overall transfer level: Needs assistance Equipment used: Rolling walker (2 wheeled) Transfers: Sit to/from Stand Sit to Stand: Min guard         General transfer comment: Min verbal cues for sequencing during  transfers  Ambulation/Gait Ambulation/Gait assistance: Min guard;+2 safety/equipment Ambulation Distance (Feet): 60 Feet Assistive device: Rolling walker (2 wheeled) Gait Pattern/deviations: Decreased step length - right;Decreased step length - left;Trunk flexed   Gait velocity interpretation: Below normal speed for age/gender General Gait Details: Slow cadence with gait and antalgic on RLE but no buckling or assistance required to prevent LOB   Stairs            Wheelchair Mobility    Modified Rankin (Stroke Patients Only)       Balance Overall balance assessment: Needs assistance Sitting-balance support: Feet supported;No upper extremity supported Sitting balance-Leahy Scale: Good Sitting balance - Comments: able to sit at EOB w/o back support and maintain upright posture under PT superivision   Standing balance support: Bilateral upper extremity supported Standing balance-Leahy Scale: Good                      Cognition Arousal/Alertness: Awake/alert Behavior During Therapy: WFL for tasks assessed/performed Overall Cognitive Status: No family/caregiver present to determine baseline cognitive functioning                 General Comments: Pt oriented to self only     Exercises Total Joint Exercises Ankle Circles/Pumps: AROM;Both;10 reps;5 reps Quad Sets: Strengthening;Both;5 reps;10 reps Heel Slides: AROM;Both;10 reps Hip ABduction/ADduction: AROM;Both;10 reps Long Arc Quad: AROM;Both;10 reps;5 reps Knee Flexion: AROM;Both;10 reps;5 reps    General Comments        Pertinent Vitals/Pain Pain Assessment: No/denies pain    Home Living  Prior Function            PT Goals (current goals can now be found in the care plan section) Progress towards PT goals: Progressing toward goals    Frequency    Min 2X/week      PT Plan Current plan remains appropriate    Co-evaluation             End of  Session Equipment Utilized During Treatment: Oxygen;Gait belt Activity Tolerance: Patient tolerated treatment well Patient left: in bed;with bed alarm set;with call bell/phone within reach Nurse Communication: Mobility status;Other (comment) (Pt found in room with cannula removed from nose with SpO2 at 85%, cannula repositioned and SpO2 returned to 91-93% during remainder of session.)       Time: 1135-1204 PT Time Calculation (min) (ACUTE ONLY): 29 min  Charges:  $Gait Training: 8-22 mins $Therapeutic Exercise: 8-22 mins                    G Codes:       DElly Modena PT, DPT 01/20/17, 1:02 PM

## 2017-01-20 NOTE — Progress Notes (Signed)
[facility] - [place] at [facility]   PATIENT NAME: [name]    MR#:  [id number]  DATE OF BIRTH:  10/22/1926  SUBJECTIVE:  CHIEF COMPLAINT:   Chief Complaint  Patient presents with  . Weight Gain   Weakness, no shortness of breath or leg swelling. On oxygen [place] 2 L. REVIEW OF SYSTEMS:  Review of Systems  Constitutional: Positive for malaise/fatigue. Negative for chills and fever.  HENT: Negative for congestion and sore throat.   Eyes: Negative for blurred vision and double vision.  Respiratory: Negative for cough, hemoptysis, sputum production, shortness of breath, wheezing and stridor.   Cardiovascular: Negative for chest pain and leg swelling.  Gastrointestinal: Negative for abdominal pain, blood in stool, constipation, diarrhea, melena, nausea and vomiting.  Genitourinary: Negative for dysuria and hematuria.  Musculoskeletal: Negative for joint pain.  Skin: Negative for itching and rash.  Neurological: Positive for weakness. Negative for dizziness, focal weakness and loss of consciousness.  Psychiatric/Behavioral: Negative for depression. The patient is not nervous/anxious.     DRUG ALLERGIES:  No Known Allergies VITALS:  Blood pressure 123/72, pulse 81, temperature 97.4 F (36.3 C), temperature source Oral, resp. rate 18, height 4\' 11"  (1.499 m), weight 171 lb 14.4 oz (78 kg), SpO2 93 %. PHYSICAL EXAMINATION:  Physical Exam  Constitutional: She is oriented to person, place, and time and well-developed, well-nourished, and in no distress.  HENT:  Head: Normocephalic.  Mouth/Throat: Oropharynx is clear and moist.  Eyes: Conjunctivae and EOM are normal. No scleral icterus.  Neck: Normal range of motion. Neck supple. No JVD present. No tracheal deviation present.  Cardiovascular: Normal rate, regular rhythm and normal heart sounds.  Exam reveals no gallop.   No murmur heard. Pulmonary/Chest: Effort normal. No respiratory distress. She has no wheezes.  She has rales. She exhibits no tenderness.  Abdominal: Soft. Bowel sounds are normal. She exhibits no distension. There is no tenderness. There is no rebound.  Musculoskeletal: Normal range of motion. She exhibits no edema or tenderness.  Neurological: She is alert and oriented to person, place, and time. No cranial nerve deficit.  Skin: No rash noted. No erythema.  Psychiatric: Affect normal.   LABORATORY PANEL:  Female CBC  Recent Labs Lab 01/15/17 1729  WBC 6.4  HGB 16.0  HCT 49.6*  PLT 167   ------------------------------------------------------------------------------------------------------------------ Chemistries   Recent Labs Lab 01/18/17 0631  01/20/17 0354  NA 141  < > 142  K 3.7  < > 4.0  CL 99*  < > 100*  CO2 36*  < > 34*  GLUCOSE 100*  < > 121*  BUN 38*  < > 53*  CREATININE 1.39*  < > 1.79*  CALCIUM 9.1  < > 9.3  MG 1.9  --   --   < > = values in this interval not displayed. RADIOLOGY:  No results found. ASSESSMENT AND PLAN:   This is a 83 y.o. female with a history of brain aneurysm, pancreatitis, GERD, allergies being admitted with:  1.  New onset acute diastolic congestive heart failure discontinue lasix iv bid due to worsening renal funtion, continue Coreg, Hold if SBP<100 or DBP<60,  hold lisinopril due to low BP.  Echo: EF 60%. Per Dr. Lady GaryFath, hold iv lasix and started NS iv   Acute respiratory failure with hypoxia due to above,  Try to wean off O2 [place].  continue Neb therapy as needed  2. ARF on CKD stage 3,  Worsening, Per Dr. Lady GaryFath, hold  iv lasix, NS iv, f/u BMP. - Avoid nephrotoxic medications   3. history of GERD -Continue Prilosec  4. History of allergies -Continue cetirizine   Hypomagnesemia. Improved with IV mag.  History of Intracranial bleeding due to brain aneurysm, the daughter has concern and does not want heparin SQ.  Left leg chronic DVT. VQ scan is unremarkable. No anticoagulation due to history of Intracranial bleeding  due to brain aneurysm.  Generalized weakness. PT evaluation suggested SNF, then HHPT.  All the records are reviewed and case discussed with Care Management/Social Worker. Management plans discussed with the patient, her son and they are in agreement.  CODE STATUS: DNR  TOTAL TIME TAKING CARE OF THIS PATIENT: 38 minutes.   More than 50% of the time was spent in counseling/coordination of care: YES  POSSIBLE D/C IN 2 DAYS, DEPENDING ON CLINICAL CONDITION.   [name] M.D on 01/20/2017 at 3:40 PM  Between 7am to 6pm - Pager - [phone]  After 6pm go to [email or web address] - Social research officer, government  [facility] [place] [facility]  Office  [phone]  CC: Primary care physician; BABAOFF, Lavada Mesi, MD  Note: This dictation was prepared with Dragon dictation along with smaller phrase technology. Any transcriptional errors that result from this process are unintentional.

## 2017-01-21 MED ORDER — ENOXAPARIN SODIUM 40 MG/0.4ML ~~LOC~~ SOLN
40.0000 mg | SUBCUTANEOUS | Status: DC
  Administered 2017-01-21 – 2017-01-22 (×2): 40 mg via SUBCUTANEOUS
  Filled 2017-01-21 (×2): qty 0.4

## 2017-01-21 MED ORDER — FUROSEMIDE 10 MG/ML IJ SOLN
40.0000 mg | Freq: Once | INTRAMUSCULAR | Status: AC
  Administered 2017-01-21: 40 mg via INTRAVENOUS
  Filled 2017-01-21: qty 4

## 2017-01-21 NOTE — Progress Notes (Signed)
[facility] - [place] at [facility]   PATIENT NAME: [name]    MR#:  [id number]  DATE OF BIRTH:  12/24/1925  SUBJECTIVE:  CHIEF COMPLAINT:   Chief Complaint  Patient presents with  . Weight Gain   - Very hard of hearing. Appears congested today and renal function still worse. -Foley catheter was placed when she came in for urinary retention.  REVIEW OF SYSTEMS:  Review of Systems  Constitutional: Positive for malaise/fatigue. Negative for chills and fever.  HENT: Positive for hearing loss. Negative for congestion, ear discharge and nosebleeds.   Eyes: Negative for blurred vision and double vision.  Respiratory: Positive for shortness of breath. Negative for cough and wheezing.   Cardiovascular: Negative for chest pain, palpitations and leg swelling.  Gastrointestinal: Negative for abdominal pain, constipation, diarrhea, nausea and vomiting.  Genitourinary: Negative for dysuria.  Musculoskeletal: Negative for myalgias.  Neurological: Negative for dizziness, sensory change, speech change, focal weakness, seizures and headaches.  Psychiatric/Behavioral: Negative for depression.    DRUG ALLERGIES:  No Known Allergies  VITALS:  Blood pressure 122/79, pulse 83, temperature 98.3 F (36.8 C), temperature source Oral, resp. rate (!) 24, height 4\' 11"  (1.499 m), weight 78 kg (172 lb), SpO2 93 %.  PHYSICAL EXAMINATION:  Physical Exam  GENERAL:  83 y.o.-year-old patient lying in the bed with no acute distress.  EYES: Pupils equal, round, reactive to light and accommodation. No scleral icterus. Extraocular muscles intact.  HEENT: Head atraumatic, normocephalic. Oropharynx and nasopharynx clear.  NECK:  Supple, no jugular venous distention. No thyroid enlargement, no tenderness.  LUNGS: Normal breath sounds bilaterally, Diffuse crackles at the bases posteriorly, no wheezing or rhonchi.. No use of accessory muscles of respiration.  CARDIOVASCULAR: S1, S2 normal.  No  rubs, or gallops. 2/6 systolic murmur is present ABDOMEN: Soft, nontender, nondistended. Bowel sounds present. No organomegaly or mass.  EXTREMITIES: No pedal edema, cyanosis, or clubbing.  NEUROLOGIC: Cranial nerves II through XII are intact. Muscle strength 5/5 in all extremities. Sensation intact. Gait not checked. Overall weakness is noted PSYCHIATRIC: The patient is alert and oriented x 2-3.  SKIN: No obvious rash, lesion, or ulcer.    LABORATORY PANEL:   CBC  Recent Labs Lab 01/15/17 1729  WBC 6.4  HGB 16.0  HCT 49.6*  PLT 167   ------------------------------------------------------------------------------------------------------------------  Chemistries   Recent Labs Lab 01/18/17 0631  01/20/17 0354  NA 141  < > 142  K 3.7  < > 4.0  CL 99*  < > 100*  CO2 36*  < > 34*  GLUCOSE 100*  < > 121*  BUN 38*  < > 53*  CREATININE 1.39*  < > 1.79*  CALCIUM 9.1  < > 9.3  MG 1.9  --   --   < > = values in this interval not displayed. ------------------------------------------------------------------------------------------------------------------  Cardiac Enzymes  Recent Labs Lab 01/16/17 1049  TROPONINI 0.03*   ------------------------------------------------------------------------------------------------------------------  RADIOLOGY:  No results found.  EKG:   Orders placed or performed during the hospital encounter of 01/15/17  . ED EKG  . ED EKG    ASSESSMENT AND PLAN:   83 year old female with past medical history significant for brain aneurysm, history of pancreatitis, diastolic CHF presents to hospital secondary to worsening shortness of breath and weight gain.  #1 acute diastolic CHF-echo with normal ejection fraction but diastolic dysfunction. -Patient was diuresed, however with worsening renal function fluids were given again. -Sounds congested again today, discontinue IV fluids and  give 1 dose of Lasix. -Appreciate cardiology consult.  Discontinue Foley catheter today. -VQ scan with less probability for PE. Patient chronically on home oxygen. Continue 2 L at discharge.  #2 acute renal failure-could be cardiorenal syndrome. Getting worse with Lasix, so received fluids, however did not help and still getting worse. -Lasix one-time dose today and recheck renal function in a.m. If not improving, we'll get nephrology consult and renal ultrasound.  #3 GERD-on Protonix here  #4 DVT prophylaxis-started Lovenox  Physical Therapy recommended home health     All the records are reviewed and case discussed with Care Management/Social Workerr. Management plans discussed with the patient, family and they are in agreement.  CODE STATUS: DO NOT RESUSCITATE  TOTAL TIME TAKING CARE OF THIS PATIENT: 37 minutes.   POSSIBLE D/C IN 1-2 DAYS, DEPENDING ON CLINICAL CONDITION.   Enid BaasKALISETTI,Elianie Hubers M.D on 01/21/2017 at 10:51 AM  Between 7am to 6pm - Pager - [phone]  After 6pm go to [email or web address] - password [email or web address]  [facility] [place] [facility]  Office  [phone]  CC: Primary care physician; BABAOFF, Lavada MesiMARC E, MD

## 2017-01-21 NOTE — Care Management (Signed)
Agency preference for home health is [facility].  spoke with [facility] regarding SN and PT services.  Patient does indeed already have a portable oxygen concentrator.

## 2017-01-22 ENCOUNTER — Inpatient Hospital Stay: Payer: Medicare Other

## 2017-01-22 LAB — BASIC METABOLIC PANEL
Anion gap: 5 (ref 5–15)
BUN: 40 mg/dL — AB (ref 6–20)
CALCIUM: 9.6 mg/dL (ref 8.9–10.3)
CO2: 36 mmol/L — ABNORMAL HIGH (ref 22–32)
CREATININE: 1.09 mg/dL — AB (ref 0.44–1.00)
Chloride: 102 mmol/L (ref 101–111)
GFR calc Af Amer: 51 mL/min — ABNORMAL LOW (ref >60)
GFR, EST NON AFRICAN AMERICAN: 44 mL/min — AB (ref >60)
Glucose, Bld: 99 mg/dL (ref 65–99)
POTASSIUM: 3.9 mmol/L (ref 3.5–5.1)
SODIUM: 143 mmol/L (ref 135–145)

## 2017-01-22 MED ORDER — METHYLPREDNISOLONE SODIUM SUCC 40 MG IJ SOLR
40.0000 mg | Freq: Every day | INTRAMUSCULAR | Status: DC
  Administered 2017-01-22 – 2017-01-23 (×2): 40 mg via INTRAVENOUS
  Filled 2017-01-22 (×2): qty 1

## 2017-01-22 MED ORDER — FUROSEMIDE 20 MG PO TABS
20.0000 mg | ORAL_TABLET | Freq: Every day | ORAL | Status: DC
  Administered 2017-01-22 – 2017-01-23 (×2): 20 mg via ORAL
  Filled 2017-01-22 (×3): qty 1

## 2017-01-22 NOTE — Progress Notes (Signed)
Physical Therapy Treatment Patient Details Name: [name] MRN: [id number] DOB: 02/25/1926 Today's Date: 01/22/2017    History of Present Illness Patient is an 83 y.o. female admitted on 23 FEB from PCP due to 20# weight gain since DEC and SOB; patient reportedly has home O2 but does not use it. Patient was at PCP to get injections in R knee. PMH includes brain aneurysm that impacts short-term memory and pancreatitis.     PT Comments    Pt shows good effort with exercises and ambulation.  Her biggest issue appears to be R knee pain with WBing (had minimal effect on ability to do exercises) that she was scheduled to have a cortisone shot in the day she came to the hospital.  Pt showed ability to walk in-home distances, but overall is not at her baseline (using cane) and lacks confidence with prolonged mobility and is reliant on O2 (on 2 liters, sats dropping to the mid 80s with ambulation and took a few minutes to recover back to the 90s.)  Follow Up Recommendations  Home health PT;Supervision/Assistance - 24 hour     Equipment Recommendations   (pt has walker and rollator)    Recommendations for Other Services       Precautions / Restrictions Precautions Precautions: Fall Restrictions Weight Bearing Restrictions: No    Mobility  Bed Mobility Overal bed mobility: Needs Assistance Bed Mobility: Supine to Sit     Supine to sit: Min guard     General bed mobility comments: light use of rails, pt gets to sitting with good relative confidence  Transfers Overall transfer level: Modified independent Equipment used: Rolling walker (2 wheeled) Transfers: Sit to/from Stand Sit to Stand: Min guard         General transfer comment: Pt is able to rise to standing without direct assist, she did need cuing for hand placement with each transition.   Ambulation/Gait Ambulation/Gait assistance: Supervision Ambulation Distance (Feet): 75 Feet Assistive device: Rolling walker (2  wheeled)       General Gait Details: Pt with limp on R LE with each step.  She needs regular cuing to maintain upright posture but was generally safe t/o the effort. Pt on 2 liters O2 during ambualtion, O2 slowly drops from low 90s to mid 80s.   Stairs            Wheelchair Mobility    Modified Rankin (Stroke Patients Only)       Balance Overall balance assessment: Modified Independent Sitting-balance support: Bilateral upper extremity supported Sitting balance-Leahy Scale: Good     Standing balance support: Bilateral upper extremity supported (reliant on the walker) Standing balance-Leahy Scale: Good                      Cognition Arousal/Alertness: Awake/alert Behavior During Therapy: WFL for tasks assessed/performed Overall Cognitive Status: No family/caregiver present to determine baseline cognitive functioning                      Exercises General Exercises - Lower Extremity Ankle Circles/Pumps: 10 reps;AROM Heel Slides: Strengthening;10 reps Hip ABduction/ADduction: Strengthening;10 reps Straight Leg Raises: Strengthening;10 reps    General Comments        Pertinent Vitals/Pain Pain Assessment: No/denies pain    Home Living                      Prior Function  PT Goals (current goals can now be found in the care plan section) Progress towards PT goals: Progressing toward goals    Frequency    Min 2X/week      PT Plan Current plan remains appropriate    Co-evaluation             End of Session Equipment Utilized During Treatment: Oxygen;Gait belt Activity Tolerance: Patient tolerated treatment well Patient left: with chair alarm set;with call bell/phone within reach;with family/visitor present Nurse Communication: Mobility status PT Visit Diagnosis: Unsteadiness on feet (R26.81);History of falling (Z91.81);Difficulty in walking, not elsewhere classified (R26.2);Muscle weakness (generalized)  (M62.81)     Time: 1610-9604 PT Time Calculation (min) (ACUTE ONLY): 28 min  Charges:  $Gait Training: 8-22 mins $Therapeutic Exercise: 8-22 mins                    G Codes:       Malachi Pro, DPT 01/22/2017, 2:36 PM

## 2017-01-22 NOTE — Progress Notes (Signed)
[facility] - [place] at [facility]   PATIENT NAME: [name]    MR#:  [id number]  DATE OF BIRTH:  18-Sep-1926  SUBJECTIVE:  CHIEF COMPLAINT:   Chief Complaint  Patient presents with  . Weight Gain   - breathing is improving, remains on 2L o2 - confusion persists, received lasix yesterday, renal function is better today  REVIEW OF SYSTEMS:  Review of Systems  Constitutional: Positive for malaise/fatigue. Negative for chills and fever.  HENT: Positive for hearing loss. Negative for congestion, ear discharge and nosebleeds.   Eyes: Negative for blurred vision and double vision.  Respiratory: Positive for shortness of breath. Negative for cough and wheezing.   Cardiovascular: Negative for chest pain, palpitations and leg swelling.  Gastrointestinal: Negative for abdominal pain, constipation, diarrhea, nausea and vomiting.  Genitourinary: Negative for dysuria.  Musculoskeletal: Negative for myalgias.  Neurological: Negative for dizziness, sensory change, speech change, focal weakness, seizures and headaches.  Psychiatric/Behavioral: Negative for depression.    DRUG ALLERGIES:  No Known Allergies  VITALS:  Blood pressure 133/74, pulse 88, temperature 97.7 F (36.5 C), resp. rate 18, height 4\' 11"  (1.499 m), weight 76.6 kg (168 lb 14.4 oz), SpO2 91 %.  PHYSICAL EXAMINATION:  Physical Exam  GENERAL:  83 y.o.-year-old patient lying in the bed with no acute distress.  EYES: Pupils equal, round, reactive to light and accommodation. No scleral icterus. Extraocular muscles intact.  HEENT: Head atraumatic, normocephalic. Oropharynx and nasopharynx clear.  NECK:  Supple, no jugular venous distention. No thyroid enlargement, no tenderness.  LUNGS: coarse  breath sounds bilaterally, Diffuse crackles at the bases posteriorly, no wheezing or rhonchi.. No use of accessory muscles of respiration.  CARDIOVASCULAR: S1, S2 normal. No  rubs, or gallops. 2/6 systolic murmur is  present ABDOMEN: Soft, nontender, nondistended. Bowel sounds present. No organomegaly or mass.  EXTREMITIES: No pedal edema, cyanosis, or clubbing.  NEUROLOGIC: Cranial nerves II through XII are intact. Muscle strength 5/5 in all extremities. Sensation intact. Gait not checked. Overall weakness is noted PSYCHIATRIC: The patient is alert and oriented x 2-3.  SKIN: No obvious rash, lesion, or ulcer.    LABORATORY PANEL:   CBC  Recent Labs Lab 01/15/17 1729  WBC 6.4  HGB 16.0  HCT 49.6*  PLT 167   ------------------------------------------------------------------------------------------------------------------  Chemistries   Recent Labs Lab 01/18/17 0631  01/22/17 0615  NA 141  < > 143  K 3.7  < > 3.9  CL 99*  < > 102  CO2 36*  < > 36*  GLUCOSE 100*  < > 99  BUN 38*  < > 40*  CREATININE 1.39*  < > 1.09*  CALCIUM 9.1  < > 9.6  MG 1.9  --   --   < > = values in this interval not displayed. ------------------------------------------------------------------------------------------------------------------  Cardiac Enzymes  Recent Labs Lab 01/16/17 1049  TROPONINI 0.03*   ------------------------------------------------------------------------------------------------------------------  RADIOLOGY:  Dg Chest 2 View  Result Date: 01/22/2017 CLINICAL DATA:  Shortness of Breath EXAM: CHEST  2 VIEW COMPARISON:  01/18/2017 FINDINGS: Cardiac shadow is enlarged but stable. The lungs are hypoinflated. Diffuse changes of chronic fibrosis are again seen. Elevation of the left hemidiaphragm is noted. New new focal infiltrate or sizable effusion is seen. IMPRESSION: Chronic fibrotic changes without acute abnormality. The overall inspiratory effort is poor. Electronically Signed   By: Alcide Clever M.D.   On: 01/22/2017 09:51    EKG:   Orders placed or performed during the hospital encounter  of 01/15/17  . ED EKG  . ED EKG    ASSESSMENT AND PLAN:   83 year old female with past  medical history significant for brain aneurysm, history of pancreatitis, diastolic CHF presents to hospital secondary to worsening shortness of breath and weight gain.  #1 acute diastolic CHF-echo with normal ejection fraction but diastolic dysfunction. -Patient was diuresed, however with worsening renal function fluids were given- but renal functioned worsened again- so fluids stopped yesterday and a dose of IV lasix given- renal function is better - f/u CXR with interstitial lung disease likely- start oral lasix - start solumedrol daily, encourage incentive spirometry -Appreciate cardiology consult. Discontinued Foley catheter. -VQ scan with less probability for PE. Patient chronically on home oxygen. Continue 2 L at discharge.  #2 acute renal failure-could be cardiorenal syndrome. Now improving - d/c fluids and lasix started orally. Monitor closely  #3 GERD-on Protonix here  #4 DVT prophylaxis-on Lovenox  #5 Confusion- metabolic encephalopathy, has mild underlying dementia/cognitive defect per daughter in law. Worse yesterday, clearing today Monitor  Physical Therapy recommended home health     All the records are reviewed and case discussed with Care Management/Social Workerr. Management plans discussed with the patient, family and they are in agreement.  CODE STATUS: DO NOT RESUSCITATE  TOTAL TIME TAKING CARE OF THIS PATIENT: 81 minutes.   POSSIBLE D/C TOMORROW, DEPENDING ON CLINICAL CONDITION.   Enid BaasKALISETTI,Vihan Santagata M.D on 01/22/2017 at 10:56 AM  Between 7am to 6pm - Pager - [phone]  After 6pm go to [email or web address] - password [facility] [facility]  [facility] [place] [facility]  Office  [phone]  CC: Primary care physician; BABAOFF, Lavada MesiMARC E, MD

## 2017-01-22 NOTE — Care Management Important Message (Signed)
Important Message  Patient Details  Name: [name] MRN: [id number] Date of Birth: 11/25/1925   Medicare Important Message Given:  Yes    Eber HongGreene, Dayvin Aber R, RN 01/22/2017, 1:49 PM

## 2017-01-22 NOTE — Care Management (Signed)
Updated sone who is present with additional family members on discharge disposition with [facility]. He has concerns about his dog and home health staff.  [facility] informed and told there is no problem.  They were already aware of the pets.

## 2017-01-23 DIAGNOSIS — J9621 Acute and chronic respiratory failure with hypoxia: Secondary | ICD-10-CM

## 2017-01-23 DIAGNOSIS — J8410 Pulmonary fibrosis, unspecified: Secondary | ICD-10-CM

## 2017-01-23 DIAGNOSIS — G9341 Metabolic encephalopathy: Secondary | ICD-10-CM

## 2017-01-23 DIAGNOSIS — N179 Acute kidney failure, unspecified: Secondary | ICD-10-CM

## 2017-01-23 DIAGNOSIS — R600 Localized edema: Secondary | ICD-10-CM

## 2017-01-23 LAB — BASIC METABOLIC PANEL
ANION GAP: 5 (ref 5–15)
BUN: 32 mg/dL — ABNORMAL HIGH (ref 6–20)
CHLORIDE: 99 mmol/L — AB (ref 101–111)
CO2: 40 mmol/L — AB (ref 22–32)
CREATININE: 0.96 mg/dL (ref 0.44–1.00)
Calcium: 9.5 mg/dL (ref 8.9–10.3)
GFR calc non Af Amer: 51 mL/min — ABNORMAL LOW (ref >60)
GFR, EST AFRICAN AMERICAN: 59 mL/min — AB (ref >60)
Glucose, Bld: 105 mg/dL — ABNORMAL HIGH (ref 65–99)
POTASSIUM: 4.1 mmol/L (ref 3.5–5.1)
SODIUM: 144 mmol/L (ref 135–145)

## 2017-01-23 LAB — CBC
HEMATOCRIT: 42.5 % (ref 35.0–47.0)
HEMOGLOBIN: 13.8 g/dL (ref 12.0–16.0)
MCH: 31.2 pg (ref 26.0–34.0)
MCHC: 32.4 g/dL (ref 32.0–36.0)
MCV: 96.5 fL (ref 80.0–100.0)
Platelets: 151 10*3/uL (ref 150–440)
RBC: 4.41 MIL/uL (ref 3.80–5.20)
RDW: 13.7 % (ref 11.5–14.5)
WBC: 4.2 10*3/uL (ref 3.6–11.0)

## 2017-01-23 MED ORDER — FUROSEMIDE 20 MG PO TABS: 20.0000 mg | ORAL_TABLET | Freq: Every day | ORAL | 3 refills | Status: DC

## 2017-01-23 MED ORDER — PREDNISONE 5 MG PO TABS: ORAL_TABLET | ORAL | 0 refills | Status: DC

## 2017-01-23 NOTE — Care Management Note (Signed)
Case Management Note  Patient Details  Name: [name] MRN: [id number] Date of Birth: 10/14/1926  Subjective/Objective:       [facility] today [name]. Daughter [name] has spoken with [employer] on the phone and they will deliver liquid oxygen to [name] at home next week. [name] reported to this Clinical research associatewriter that [facility] will notify [facility] [employer] to remove the [facility] equipment after [employer] has set up their new liquid oxygen equipment. Shea Stakesona George at [facility] has been updated of a referral for HH-PT, RN, Aide  No other discharge needs identified.           Action/Plan:   Expected Discharge Date:  01/23/17               Expected Discharge Plan:     In-House Referral:     Discharge planning Services     Post Acute Care Choice:    Choice offered to:     DME Arranged:    DME Agency:     HH Arranged:  RN, PT, Nurse's Aide [facility]  Status of Service:  Completed, signed off  If discussed at Long Length of Stay Meetings, dates discussed:    Additional Comments:  Bo Rogue A, RN 01/23/2017, 11:35 AM

## 2017-01-23 NOTE — Progress Notes (Addendum)
Physical Therapy Treatment Patient Details Name: [name] MRN: [id number] DOB: [birth year] Today's Date: 01/23/2017    History of Present Illness Patient is an 83 y.o. female admitted on 23 FEB from PCP due to 20# weight gain since DEC and SOB; patient reportedly has home O2 but does not use it. Patient was at PCP to get injections in R knee. PMH includes brain aneurysm that impacts short-term memory and pancreatitis.     PT Comments    Bed mobility with min assist.  Stood initially with [facility] but generally unsteady reaching for counter with poor step quality.  Pt given walker.  She was able to ambulate to bathroom then an additional 40' x 2.  Overall decreased gait quality from yesterday limited by knee pain and general fatigue.  Daughter in for session.  Discussed gait quality and difference from yesterday.  She attributes it to being earlier in the am and her still being tired.  Encouraged walker upon discharge and assist with mobility.  She agreed and is still comfortable with discharge plan.  They have all equipment needed for at home.   Follow Up Recommendations  Home health PT;Supervision/Assistance - 24 hour     Equipment Recommendations       Recommendations for Other Services       Precautions / Restrictions Precautions Precautions: Fall Restrictions Weight Bearing Restrictions: No    Mobility  Bed Mobility Overal bed mobility: Needs Assistance Bed Mobility: Supine to Sit     Supine to sit: Min guard Sit to supine: Min assist      Transfers Overall transfer level: Modified independent Equipment used: Rolling walker (2 wheeled) Transfers: Sit to/from Stand Sit to Stand: Min assist         General transfer comment: verbal cues for hand placements, required some additional assist today  Ambulation/Gait Ambulation/Gait assistance: Min assist Ambulation Distance (Feet): 40 Feet Assistive device: Rolling walker (2 wheeled) Gait Pattern/deviations:  Step-through pattern;Decreased step length - right;Decreased step length - left   Gait velocity interpretation: Below normal speed for age/gender General Gait Details: 40' x 2 limited by knee pain and general fatigue today.  Decreased gait quality today.   Stairs            Wheelchair Mobility    Modified Rankin (Stroke Patients Only)       Balance Overall balance assessment: Modified Independent Sitting-balance support: Bilateral upper extremity supported Sitting balance-Leahy Scale: Good Sitting balance - Comments: able to sit at EOB w/o back support and maintain upright posture under PT superivision   Standing balance support: Bilateral upper extremity supported Standing balance-Leahy Scale: Fair Standing balance comment: requires use of RW for incresed stability,                     Cognition Arousal/Alertness: Awake/alert Behavior During Therapy: WFL for tasks assessed/performed Overall Cognitive Status: Within Functional Limits for tasks assessed                      Exercises Other Exercises Other Exercises: bathroom due to incontinence of urine with gait.    General Comments        Pertinent Vitals/Pain Pain Assessment: 0-10 Pain Score: 4  Pain Location: knees Pain Descriptors / Indicators: Sore Pain Intervention(s): Limited activity within patient's tolerance    Home Living                      Prior Function  PT Goals (current goals can now be found in the care plan section) Progress towards PT goals: Progressing toward goals    Frequency    Min 2X/week      PT Plan Current plan remains appropriate    Co-evaluation             End of Session Equipment Utilized During Treatment: Oxygen;Gait belt Activity Tolerance: Patient tolerated treatment well Patient left: with chair alarm set;with call bell/phone within reach;with family/visitor present         Time: [id number] PT Time Calculation  (min) (ACUTE ONLY): 23 min  Charges:  $Gait Training: 8-22 mins                    G Codes:       Danielle DessSarah Orry Sigl, PTA 01/23/17, 11:30 AM

## 2017-01-23 NOTE — Progress Notes (Signed)
SATURATION QUALIFICATIONS: (This note is used to comply with regulatory documentation for home oxygen)  Patient Saturations on Room Air at Rest = 85 %  Patient Saturations on Room Air while Ambulating = 82 %  Patient Saturations on 2 Liters of oxygen while Ambulating = 91 %  Please briefly explain why patient needs home oxygen:  CHF 02 Sat measurements provided by Bettey MareVerne Cowan, RN on 01/23/17.

## 2017-01-23 NOTE — Discharge Summary (Signed)
[facility] [place] [facility]   PATIENT NAME: [name]    MR#:  [id number]  DATE OF BIRTH:  [birth year]  DATE OF ADMISSION:  01/15/2017 ADMITTING PHYSICIAN: Jon Gills Hugelmeyer, DO  DATE OF DISCHARGE: No discharge date for patient encounter.  PRIMARY CARE PHYSICIAN: BABAOFF, MARC E, MD     ADMISSION DIAGNOSIS:  Congestive heart failure, unspecified congestive heart failure chronicity, unspecified congestive heart failure type (HCC) [I50.9]  DISCHARGE DIAGNOSIS:  Principal Problem:   Acute on chronic respiratory failure with hypoxia (HCC) Active Problems:   Heart failure (HCC)   Acute diastolic congestive heart failure (HCC)   Leg edema   Acute renal failure (HCC)   Pulmonary fibrosis (HCC)   Metabolic encephalopathy   SECONDARY DIAGNOSIS:   Past Medical History:  Diagnosis Date  . Brain aneurysm 1998  . Pancreatitis     .pro HOSPITAL COURSE:   The patient is 83 year old Caucasian female with past medical history significant for history of brain aneurysm, pancreatitis, who presented to the hospital with complaints of right knee pain, lower extremity swelling, weight gain. She was also short of breath, had decreased energy. On arrival to the hospital, she was noted to have vessel congestion, pulmonary edema, lower extremity swelling. She was admitted to the hospital for IV Lasix. She was diuresed, with worsening of her kidney function. Creatinine worsened to 1.79. Diuretics were stopped and her kidney function improved. Repeat the chest x-ray revealed pulmonary fibrosis, which was felt to be major culprit of her hypoxemia. Patient was requalified for the oxygen therapy at home. She was felt to be stable to be discharged home today. She was evaluated by physical therapist and recommended home health services, 24-hour supervision/assistance. Discussion by problem:  #1 acute diastolic CHF-echo with normal ejection fraction but diastolic  dysfunction. -Patient was diuresed, however had worsening renal function, IV  fluids were given,  renal function improved - Repeated CXR revealed interstitial lung changes- the patient is to continue oral lasix, prednisone slow taper, follow-up with pulmonologist as outpatient. She was prequalified for oxygen therapy at home. --VQ scan with less probability for PE. The patient would benefit from pulmonary consultation as outpatient  #2 acute renal failure, suspected due to intravascular depletion, improved, follow kidney function as outpatient closely, since she is on Lasix orally  #3 . Gastroesophageal reflux disease, continue PPI  #4 . Generalized weakness, physical therapist recommended home health services, which will be arranged for patient upon discharge  #5 Confusion- likely metabolic encephalopathy, has mild underlying dementia/cognitive defect per daughter in law. Improved clinically  DISCHARGE CONDITIONS:   Stable  CONSULTS OBTAINED:  Treatment Team:  Dalia Heading, MD  DRUG ALLERGIES:  No Known Allergies  DISCHARGE MEDICATIONS:   Current Discharge Medication List    START taking these medications   Details  furosemide (LASIX) 20 MG tablet Take 1 tablet (20 mg total) by mouth daily. Qty: 30 tablet, Refills: 3    predniSONE (DELTASONE) 5 MG tablet Label  & dispense according to the schedule below. 10 Pills PO for 3 days then, 8 Pills PO for 3 days, 6 Pills PO for 3 days, 4 Pills PO for 3 days, 2 Pills PO for 3 days, 1 Pills PO for 3 days, 1/2 Pill  PO for 3 days then STOP. Total 95 pills. Qty: 95 tablet, Refills: 0      CONTINUE these medications which have NOT CHANGED   Details  Budesonide (PULMICORT FLEXHALER) 90 MCG/ACT inhaler Inhale 1  puff into the lungs 2 (two) times daily.    cetirizine (ZYRTEC) 10 MG tablet Take 10 mg by mouth daily.    cholecalciferol (VITAMIN D) 1000 units tablet Take 1,000 Units by mouth daily.    Multiple Vitamins-Minerals  (PRESERVISION AREDS) TABS Take 1 tablet by mouth 2 (two) times daily.    omeprazole (PRILOSEC) 20 MG capsule Take 20 mg by mouth daily.         DISCHARGE INSTRUCTIONS:    The patient is to follow-up with primary care physician within one week after discharge, she would benefit from pulmonary consultation as outpatient  If you experience worsening of your admission symptoms, develop shortness of breath, life threatening emergency, suicidal or homicidal thoughts you must seek medical attention immediately by calling 911 or calling your MD immediately  if symptoms less severe.  You Must read complete instructions/literature along with all the possible adverse reactions/side effects for all the Medicines you take and that have been prescribed to you. Take any new Medicines after you have completely understood and accept all the possible adverse reactions/side effects.   Please note  You were cared for by a hospitalist during your hospital stay. If you have any questions about your discharge medications or the care you received while you were in the hospital after you are discharged, you can call the unit and asked to speak with the hospitalist on call if the hospitalist that took care of you is not available. Once you are discharged, your primary care physician will handle any further medical issues. Please note that NO REFILLS for any discharge medications will be authorized once you are discharged, as it is imperative that you return to your primary care physician (or establish a relationship with a primary care physician if you do not have one) for your aftercare needs so that they can reassess your need for medications and monitor your lab values.    Today   CHIEF COMPLAINT:   Chief Complaint  Patient presents with  . Weight Gain    HISTORY OF PRESENT ILLNESS:  [name]  is a 83 y.o. female with a known history ofbrain aneurysm, pancreatitis, who presented to the hospital with  complaints of right knee pain, lower extremity swelling, weight gain. She was also short of breath, had decreased energy. On arrival to the hospital, she was noted to have vessel congestion, pulmonary edema, lower extremity swelling. She was admitted to the hospital for IV Lasix. She was diuresed, with worsening of her kidney function. Creatinine worsened to 1.79. Diuretics were stopped and her kidney function improved. Repeat the chest x-ray revealed pulmonary fibrosis, which was felt to be major culprit of her hypoxemia. Patient was requalified for the oxygen therapy at home. She was felt to be stable to be discharged home today. She was evaluated by physical therapist and recommended home health services, 24-hour supervision/assistance. Discussion by problem:  #1 acute diastolic CHF-echo with normal ejection fraction but diastolic dysfunction. -Patient was diuresed, however had worsening renal function, IV  fluids were given,  renal function improved - Repeated CXR revealed interstitial lung changes- the patient is to continue oral lasix, prednisone slow taper, follow-up with pulmonologist as outpatient. She was prequalified for oxygen therapy at home. --VQ scan with less probability for PE. The patient would benefit from pulmonary consultation as outpatient  #2 acute renal failure, suspected due to intravascular depletion, improved, follow kidney function as outpatient closely, since she is on Lasix orally  #3 . Gastroesophageal reflux disease,  continue PPI  #4 . Generalized weakness, physical therapist recommended home health services, which will be arranged for patient upon discharge  #5 Confusion- likely metabolic encephalopathy, has mild underlying dementia/cognitive defect per daughter in law. Improved clinically     VITAL SIGNS:  Blood pressure 135/71, pulse 85, temperature 97.3 F (36.3 C), temperature source Oral, resp. rate 20, height  (1.499 m), weight 76.3 kg (168 lb  4.8 oz), SpO2 95 %.  I/O:   Intake/Output Summary (Last 24 hours) at 01/23/17 1255 Last data filed at 01/23/17 1118  Gross per 24 hour  Intake              360 ml  Output              500 ml  Net             -140 ml    PHYSICAL EXAMINATION:  GENERAL:  83 y.o.-year-old patient lying in the bed with no acute distress.  EYES: Pupils equal, round, reactive to light and accommodation. No scleral icterus. Extraocular muscles intact.  HEENT: Head atraumatic, normocephalic. Oropharynx and nasopharynx clear.  NECK:  Supple, no jugular venous distention. No thyroid enlargement, no tenderness.  LUNGS: Normal breath sounds bilaterally, no wheezing, rales,rhonchi , But diffuse crepitations noted anteriorly. No use of accessory muscles of respiration.  CARDIOVASCULAR: S1, S2 normal. No murmurs, rubs, or gallops.  ABDOMEN: Soft, non-tender, non-distended. Bowel sounds present. No organomegaly or mass.  EXTREMITIES: No pedal edema, cyanosis, or clubbing.  NEUROLOGIC: Cranial nerves II through XII are intact. Muscle strength 5/5 in all extremities. Sensation intact. Gait not checked.  PSYCHIATRIC: The patient is alert and oriented x 3.  SKIN: No obvious rash, lesion, or ulcer.   DATA REVIEW:   CBC  Recent Labs Lab 01/23/17 0615  WBC 4.2  HGB 13.8  HCT 42.5  PLT 151    Chemistries   Recent Labs Lab 01/18/17 0631  01/23/17 0615  NA 141  < > 144  K 3.7  < > 4.1  CL 99*  < > 99*  CO2 36*  < > 40*  GLUCOSE 100*  < > 105*  BUN 38*  < > 32*  CREATININE 1.39*  < > 0.96  CALCIUM 9.1  < > 9.5  MG 1.9  --   --   < > = values in this interval not displayed.  Cardiac Enzymes No results for input(s): TROPONINI in the last 168 hours.  Microbiology Results  Results for orders placed or performed in visit on 03/30/14  Culture, blood (single)     Status: None   Collection Time: 03/30/14  7:33 PM  Result Value Ref Range Status   Micro Text Report   Final       COMMENT                    NO GROWTH AEROBICALLY/ANAEROBICALLY IN 5 DAYS   ANTIBIOTIC                                                      Culture, blood (single)     Status: None   Collection Time: 03/30/14  7:33 PM  Result Value Ref Range Status   Micro Text Report   Final       COMMENT  NO GROWTH AEROBICALLY/ANAEROBICALLY IN 5 DAYS   ANTIBIOTIC                                                        RADIOLOGY:  Dg Chest 2 View  Result Date: 01/22/2017 CLINICAL DATA:  Shortness of Breath EXAM: CHEST  2 VIEW COMPARISON:  01/18/2017 FINDINGS: Cardiac shadow is enlarged but stable. The lungs are hypoinflated. Diffuse changes of chronic fibrosis are again seen. Elevation of the left hemidiaphragm is noted. New new focal infiltrate or sizable effusion is seen. IMPRESSION: Chronic fibrotic changes without acute abnormality. The overall inspiratory effort is poor. Electronically Signed   By: Alcide Clever M.D.   On: 01/22/2017 09:51    EKG:   Orders placed or performed during the hospital encounter of 01/15/17  . ED EKG  . ED EKG      Management plans discussed with the patient, family and they are in agreement.  CODE STATUS:     Code Status Orders        Start     Ordered   01/15/17 2143  Do not attempt resuscitation (DNR)  Continuous    Question Answer Comment  In the event of cardiac or respiratory ARREST Do not call a "code blue"   In the event of cardiac or respiratory ARREST Do not perform Intubation, CPR, defibrillation or ACLS   In the event of cardiac or respiratory ARREST Use medication by any route, position, wound care, and other measures to relive pain and suffering. May use oxygen, suction and manual treatment of airway obstruction as needed for comfort.      01/15/17 2142    Code Status History    Date Active Date Inactive Code Status Order ID Comments User Context   This patient has a current code status but no historical code status.    Advance Directive  Documentation   Flowsheet Row Most Recent Value  Type of Advance Directive  Healthcare Power of Attorney, Living will  Pre-existing out of facility DNR order (yellow form or pink MOST form)  No data  "MOST" Form in Place?  No data      TOTAL TIME TAKING CARE OF THIS PATIENT: 40 minutes.    Katharina Caper M.D on 01/23/2017 at 12:55 PM  Between 7am to 6pm - Pager - [phone]  After 6pm go to [email or web address] - password [email or web address] [facility]  [facility]  Office  [phone]  CC: Primary care physician; BABAOFF, Lavada Mesi, MD

## 2017-01-23 NOTE — Care Management Note (Signed)
Case Management Note  Patient Details  Name: Kellie IgoLucy V Patel MRN: [id number] Date of Birth: 06/27/1926  Subjective/Objective:     [name] is being discharged to [address], [place], [place] .Lives with daughter [name] whose cell phone is [phone].               Action/Plan:   Expected Discharge Date:  01/23/17               Expected Discharge Plan:     In-House Referral:     Discharge planning Services     Post Acute Care Choice:    Choice offered to:     DME Arranged:    DME Agency:     HH Arranged:  RN, PT, Nurse's Aide HH Agency:  [facility]  Status of Service:  Completed, signed off  If discussed at Long Length of Stay Meetings, dates discussed:    Additional Comments:  Rahsaan Weakland A, RN 01/23/2017, 1:35 PM

## 2017-01-23 NOTE — Progress Notes (Signed)
Patient's oxygen level sitting without oxygen is 85% Patient's oxygen level ambulating without oxygen is 82% Patient's oxygen level ambulating with oxygen is 91% at 2 liters  Loel RoVerne H. Maguire Killmer, RN 01/23/17 11:16

## 2017-01-23 NOTE — Care Management Note (Signed)
Case Management Note  Patient Details  Name: Kellie IgoLucy V Patel MRN: [id number] Date of Birth: 01/11/1926  Subjective/Objective:     BSC requested to be delivered to [name]'s [facility] today to [facility]erm[facility] A referral for home health PT, RN, Aide was called to Shea Stakesona George at [facility]. [name] is current receiving home oxygen from [facility]. However, [facility] cannot supply the light weight Helios Liquid oxygen portable tank. [facility] only supplies the "Simply Go " portable tank that must be pulled by [name] while she uses her RW which her daughter [name] reports [name] cannot manage safely. This was discussed with [name] at [facility]. This Clinical research associatewriter spoke with [name] at [facility] who does provide Helios liquid oxygen and requested that she speak with [name] daughter [name] this morning to discuss the financial details. Currently pending an order for Helios Liquid Oxygen and the oxygen saturation measurements.                Action/Plan:   Expected Discharge Date:  01/23/17               Expected Discharge Plan:     In-House Referral:     Discharge planning Services     Post Acute Care Choice:    Choice offered to:     DME Arranged:    DME Agency:     HH Arranged:  RN, PT, Nurse's Aide HH Agency[facility]  Status of Service:  Completed, signed off  If discussed at Long Length of Stay Meetings, dates discussed:    Additional Comments:  Yaniel Limbaugh A, RN 01/23/2017, 10:53 AM

## 2017-01-29 ENCOUNTER — Encounter: Payer: Self-pay | Admitting: Family

## 2017-01-29 ENCOUNTER — Ambulatory Visit: Payer: Medicare Other | Attending: Family | Admitting: Family

## 2017-01-29 ENCOUNTER — Ambulatory Visit: Payer: Medicare Other | Admitting: Family

## 2017-01-29 VITALS — BP 121/72 | HR 98 | Resp 18 | Ht 59.0 in | Wt 163.4 lb

## 2017-01-29 DIAGNOSIS — I444 Left anterior fascicular block: Secondary | ICD-10-CM | POA: Diagnosis not present

## 2017-01-29 DIAGNOSIS — I4510 Unspecified right bundle-branch block: Secondary | ICD-10-CM | POA: Insufficient documentation

## 2017-01-29 DIAGNOSIS — J8410 Pulmonary fibrosis, unspecified: Secondary | ICD-10-CM

## 2017-01-29 DIAGNOSIS — R0789 Other chest pain: Secondary | ICD-10-CM | POA: Diagnosis not present

## 2017-01-29 DIAGNOSIS — I452 Bifascicular block: Secondary | ICD-10-CM | POA: Diagnosis not present

## 2017-01-29 DIAGNOSIS — I5031 Acute diastolic (congestive) heart failure: Secondary | ICD-10-CM | POA: Insufficient documentation

## 2017-01-29 DIAGNOSIS — R079 Chest pain, unspecified: Secondary | ICD-10-CM | POA: Insufficient documentation

## 2017-01-29 LAB — BASIC METABOLIC PANEL
ANION GAP: 6 (ref 5–15)
BUN: 28 mg/dL — AB (ref 6–20)
CO2: 41 mmol/L — ABNORMAL HIGH (ref 22–32)
Calcium: 9.7 mg/dL (ref 8.9–10.3)
Chloride: 97 mmol/L — ABNORMAL LOW (ref 101–111)
Creatinine, Ser: 0.99 mg/dL (ref 0.44–1.00)
GFR calc Af Amer: 57 mL/min — ABNORMAL LOW (ref >60)
GFR, EST NON AFRICAN AMERICAN: 49 mL/min — AB (ref >60)
Glucose, Bld: 125 mg/dL — ABNORMAL HIGH (ref 65–99)
POTASSIUM: 3.9 mmol/L (ref 3.5–5.1)
SODIUM: 144 mmol/L (ref 135–145)

## 2017-01-29 NOTE — Patient Instructions (Addendum)
Continue weighing daily and call for an overnight weight gain of > 2 pounds or a weekly weight gain of >5 pounds.  Increase furosemide to 40mg  once daily.   Checking lab work today.   Mariel KanskyKen Fath, MD  Wednesday, March 14th at 3:30pm

## 2017-01-29 NOTE — Progress Notes (Signed)
Patient ID: [name], female    DOB: 01/05/1926, 83 y.o.   MRN: [id number]  HPI  [name] is a 83 y/o female with a history of pulmonary fibrosis, pancreatitis, brain aneurysm and chronic heart failure.   Last echo was done 01/16/17 and showed an EF of 55-60% along with trivial AR, mild MR and moderate TR.   Admitted 01/15/17 with HF exacerbation. IV diuresis was done which resulted in worsening renal function. IV fluids were given and renal function improved. CXR showed interstitial lung changes so pulmonology follow-up was recommended. Discharged home.   She presents today for her initial visit with fatigue and shortness of breath with little exertion. Has developed a little more swelling in her left lower leg. Has also had some chest pain with radiation into the left shoulder since this morning. Weight has been stable at home. Lasix was recently increased to 30mg  daily by her PCP. Has home health beginning. Does not have cardiology follow-up scheduled. Has had difficulty in obtaining more oxygen at her home.   Past Medical History:  Diagnosis Date  . Brain aneurysm 1998  . CHF (congestive heart failure) (HCC)   . Pancreatitis    Past Surgical History:  Procedure Laterality Date  . APPENDECTOMY     No family history on file.  Social History  Substance Use Topics  . Smoking status: Never Smoker  . Smokeless tobacco: Never Used  . Alcohol use No   No Known Allergies  Prior to Admission medications   Medication Sig Start Date End Date Taking? Authorizing Provider  Budesonide (PULMICORT FLEXHALER) 90 MCG/ACT inhaler Inhale 1 puff into the lungs 2 (two) times daily.   Yes Historical Provider, MD  cetirizine (ZYRTEC) 10 MG tablet Take 10 mg by mouth daily.   Yes Historical Provider, MD  cholecalciferol (VITAMIN D) 1000 units tablet Take 1,000 Units by mouth daily.   Yes Historical Provider, MD  furosemide (LASIX) 20 MG tablet Take 1 tablet (20 mg total) by mouth daily. Patient  taking differently: Take 30 mg by mouth daily.  01/23/17  Yes Kellie Caperima Vaickute, MD  Multiple Vitamins-Minerals (PRESERVISION AREDS) TABS Take 1 tablet by mouth 2 (two) times daily.   Yes Historical Provider, MD  omeprazole (PRILOSEC) 20 MG capsule Take 20 mg by mouth daily.   Yes Historical Provider, MD  potassium chloride SA (K-DUR,KLOR-CON) 20 MEQ tablet Take 20 mEq by mouth once.   Yes Historical Provider, MD  predniSONE (DELTASONE) 5 MG tablet Take 5 mg by mouth daily with breakfast. 10 pills POfor 3 days 8 pills PO 3 days 6 PO pills 3 days, 4 pills PO 3 days, 3 pills PO 3 days, 2 pills PO 3 days, 1 pills PO 3 days, 1/2 pills PO 3 days then STOP   Yes Historical Provider, MD     Review of Systems  Constitutional: Positive for appetite change and fatigue.  HENT: Negative for congestion, postnasal drip and sore throat.   Eyes: Negative.   Respiratory: Positive for shortness of breath.   Cardiovascular: Positive for chest pain and leg swelling. Negative for palpitations.  Gastrointestinal: Negative for abdominal distention and abdominal pain.  Endocrine: Negative.   Genitourinary: Negative.  Negative for dysuria.  Musculoskeletal: Positive for arthralgias (shoulder pain). Negative for back pain.  Skin: Negative.   Allergic/Immunologic: Negative.   Neurological: Negative for dizziness and light-headedness.  Hematological: Negative for adenopathy. Does not bruise/bleed easily.  Psychiatric/Behavioral: Positive for confusion (at times). Negative for dysphoric mood  and sleep disturbance. The patient is not nervous/anxious.    Vitals:   01/29/17 0850  BP: 121/72  Pulse: 98  Resp: 18  SpO2: 96%  Weight: 163 lb 6 oz (74.1 kg)  Height: 4\' 11"  (1.499 m)   Wt Readings from Last 3 Encounters:  01/29/17 163 lb 6 oz (74.1 kg)  01/23/17 168 lb 4.8 oz (76.3 kg)   Lab Results  Component Value Date   CREATININE 0.99 01/29/2017   CREATININE 0.96 01/23/2017   CREATININE 1.09 (H) 01/22/2017     Physical Exam  Constitutional: She is oriented to person, place, and time. She appears well-developed and well-nourished.  HENT:  Head: Normocephalic and atraumatic.  Eyes: Conjunctivae are normal. Pupils are equal, round, and reactive to light.  Neck: Normal range of motion. Neck supple. No JVD present.  Cardiovascular: Normal rate and regular rhythm.   Pulmonary/Chest: Effort normal. She has no wheezes. She has rales in the right upper field and the left lower field.  Abdominal: Soft. She exhibits no distension. There is no tenderness.  Musculoskeletal: She exhibits edema (3+ pitting edema in left lower leg). She exhibits no tenderness.  Neurological: She is alert and oriented to person, place, and time.  Skin: Skin is warm and dry.  Psychiatric: She has a normal mood and affect. Her behavior is normal.  Nursing note and vitals reviewed.   Assessment and Plan:  1: Acute heart failure with preserved ejection fraction- - NYHA class III - mildly fluid overloaded with pedal edema -weighing daily already. Instructed to call for an overnight weight gain of >2 pounds or a weekly weight gain of >5 pounds - not adding salt but does use a salt substitute. Written dietary information given regarding a 2000mg  sodium diet - will increase furosemide to 40mg  daily; may need some metolazone depending on her response - BMP drawn today; renal function and potassium look good - [facility] to start coming. Supposed to bring scale, blood pressure monitor and pulse ox that transmits to them - Pharm D went in and reviewed medications with them - sees PCP [name]) on 02/02/17 and can be re-evaluated at that time; may want to recheck labs since furosemide increased to 40mg  daily  2: Chest pain- - started recently and is going into the left shoulder - EKG done in the office and overread by [name] - follow-up appointment made with [name] on 02/03/17 - should pain worsen, she is to present  to the ED  3: Pulmonary fibrosis- - wears oxygen at 2L around the clock now - on prednisone taper - faxed order to [facility] who is servicing her oxygen for a small portable evaluation as daughter-in-law says their current system is not working for them.  - may need pulmonology referral  Medication list was reviewed.  To return here in 2 weeks or sooner for any questions/problems before then.

## 2017-02-05 ENCOUNTER — Emergency Department: Payer: Medicare Other

## 2017-02-05 ENCOUNTER — Encounter: Payer: Self-pay | Admitting: Intensive Care

## 2017-02-05 ENCOUNTER — Inpatient Hospital Stay
Admission: EM | Admit: 2017-02-05 | Discharge: 2017-02-08 | DRG: 065 | Disposition: A | Discharge status: Skilled Nursing Facility | Payer: Medicare Other | Attending: Internal Medicine | Admitting: Internal Medicine

## 2017-02-05 DIAGNOSIS — R410 Disorientation, unspecified: Secondary | ICD-10-CM | POA: Diagnosis present

## 2017-02-05 DIAGNOSIS — Z9981 Dependence on supplemental oxygen: Secondary | ICD-10-CM | POA: Diagnosis not present

## 2017-02-05 DIAGNOSIS — I5032 Chronic diastolic (congestive) heart failure: Secondary | ICD-10-CM | POA: Diagnosis present

## 2017-02-05 DIAGNOSIS — Z7952 Long term (current) use of systemic steroids: Secondary | ICD-10-CM | POA: Diagnosis not present

## 2017-02-05 DIAGNOSIS — J8410 Pulmonary fibrosis, unspecified: Secondary | ICD-10-CM | POA: Diagnosis present

## 2017-02-05 DIAGNOSIS — R413 Other amnesia: Secondary | ICD-10-CM | POA: Diagnosis present

## 2017-02-05 DIAGNOSIS — Z7951 Long term (current) use of inhaled steroids: Secondary | ICD-10-CM | POA: Diagnosis not present

## 2017-02-05 DIAGNOSIS — I639 Cerebral infarction, unspecified: Secondary | ICD-10-CM | POA: Diagnosis present

## 2017-02-05 DIAGNOSIS — Z8679 Personal history of other diseases of the circulatory system: Secondary | ICD-10-CM

## 2017-02-05 DIAGNOSIS — Z66 Do not resuscitate: Secondary | ICD-10-CM | POA: Diagnosis present

## 2017-02-05 DIAGNOSIS — K219 Gastro-esophageal reflux disease without esophagitis: Secondary | ICD-10-CM | POA: Diagnosis present

## 2017-02-05 DIAGNOSIS — R443 Hallucinations, unspecified: Secondary | ICD-10-CM | POA: Diagnosis present

## 2017-02-05 DIAGNOSIS — E860 Dehydration: Secondary | ICD-10-CM | POA: Diagnosis present

## 2017-02-05 LAB — COMPREHENSIVE METABOLIC PANEL
ALK PHOS: 55 U/L (ref 38–126)
ALT: 25 U/L (ref 14–54)
AST: 30 U/L (ref 15–41)
Albumin: 3.5 g/dL (ref 3.5–5.0)
Anion gap: 7 (ref 5–15)
BUN: 34 mg/dL — AB (ref 6–20)
CALCIUM: 9.3 mg/dL (ref 8.9–10.3)
CHLORIDE: 96 mmol/L — AB (ref 101–111)
CO2: 39 mmol/L — AB (ref 22–32)
CREATININE: 1.18 mg/dL — AB (ref 0.44–1.00)
GFR calc non Af Amer: 40 mL/min — ABNORMAL LOW (ref >60)
GFR, EST AFRICAN AMERICAN: 46 mL/min — AB (ref >60)
Glucose, Bld: 117 mg/dL — ABNORMAL HIGH (ref 65–99)
Potassium: 4.1 mmol/L (ref 3.5–5.1)
SODIUM: 142 mmol/L (ref 135–145)
Total Bilirubin: 1.4 mg/dL — ABNORMAL HIGH (ref 0.3–1.2)
Total Protein: 6.7 g/dL (ref 6.5–8.1)

## 2017-02-05 LAB — TROPONIN I

## 2017-02-05 LAB — CBC
HCT: 48.7 % — ABNORMAL HIGH (ref 35.0–47.0)
Hemoglobin: 15.7 g/dL (ref 12.0–16.0)
MCH: 30.7 pg (ref 26.0–34.0)
MCHC: 32.1 g/dL (ref 32.0–36.0)
MCV: 95.5 fL (ref 80.0–100.0)
PLATELETS: 155 10*3/uL (ref 150–440)
RBC: 5.1 MIL/uL (ref 3.80–5.20)
RDW: 14.8 % — AB (ref 11.5–14.5)
WBC: 12 10*3/uL — ABNORMAL HIGH (ref 3.6–11.0)

## 2017-02-05 LAB — URINALYSIS, COMPLETE (UACMP) WITH MICROSCOPIC
BACTERIA UA: NONE SEEN
BILIRUBIN URINE: NEGATIVE
GLUCOSE, UA: NEGATIVE mg/dL
HGB URINE DIPSTICK: NEGATIVE
KETONES UR: NEGATIVE mg/dL
Leukocytes, UA: NEGATIVE
Nitrite: NEGATIVE
PROTEIN: NEGATIVE mg/dL
SQUAMOUS EPITHELIAL / LPF: NONE SEEN
Specific Gravity, Urine: 1.015 (ref 1.005–1.030)
pH: 6 (ref 5.0–8.0)

## 2017-02-05 LAB — MAGNESIUM: Magnesium: 1.6 mg/dL — ABNORMAL LOW (ref 1.7–2.4)

## 2017-02-05 LAB — BRAIN NATRIURETIC PEPTIDE: B Natriuretic Peptide: 1217 pg/mL — ABNORMAL HIGH (ref 0.0–100.0)

## 2017-02-05 MED ORDER — PANTOPRAZOLE SODIUM 40 MG PO TBEC
40.0000 mg | DELAYED_RELEASE_TABLET | Freq: Every day | ORAL | Status: DC
  Administered 2017-02-06 – 2017-02-08 (×3): 40 mg via ORAL
  Filled 2017-02-05 (×4): qty 1

## 2017-02-05 MED ORDER — ACETAMINOPHEN 325 MG PO TABS
650.0000 mg | ORAL_TABLET | Freq: Four times a day (QID) | ORAL | Status: DC | PRN
  Administered 2017-02-06 – 2017-02-07 (×2): 650 mg via ORAL
  Filled 2017-02-05 (×2): qty 2

## 2017-02-05 MED ORDER — OCUVITE-LUTEIN PO TABS
1.0000 | ORAL_TABLET | Freq: Two times a day (BID) | ORAL | Status: DC
  Administered 2017-02-05 – 2017-02-08 (×6): 1 via ORAL
  Filled 2017-02-05 (×12): qty 1

## 2017-02-05 MED ORDER — ACETAMINOPHEN 650 MG RE SUPP: 650.0000 mg | Freq: Four times a day (QID) | RECTAL | Status: DC | PRN

## 2017-02-05 MED ORDER — POTASSIUM CHLORIDE CRYS ER 20 MEQ PO TBCR
20.0000 meq | EXTENDED_RELEASE_TABLET | Freq: Every day | ORAL | Status: DC
  Administered 2017-02-06 – 2017-02-08 (×3): 20 meq via ORAL
  Filled 2017-02-05 (×3): qty 1

## 2017-02-05 MED ORDER — DOCUSATE SODIUM 100 MG PO CAPS
100.0000 mg | ORAL_CAPSULE | Freq: Two times a day (BID) | ORAL | Status: DC
  Administered 2017-02-05 – 2017-02-08 (×6): 100 mg via ORAL
  Filled 2017-02-05 (×6): qty 1

## 2017-02-05 MED ORDER — FUROSEMIDE 40 MG PO TABS
40.0000 mg | ORAL_TABLET | Freq: Every day | ORAL | Status: DC
  Administered 2017-02-06 – 2017-02-08 (×3): 40 mg via ORAL
  Filled 2017-02-05 (×3): qty 1

## 2017-02-05 MED ORDER — ONDANSETRON HCL 4 MG/2ML IJ SOLN: 4.0000 mg | Freq: Four times a day (QID) | INTRAMUSCULAR | Status: DC | PRN

## 2017-02-05 MED ORDER — BUDESONIDE 0.5 MG/2ML IN SUSP
0.5000 mg | Freq: Two times a day (BID) | RESPIRATORY_TRACT | Status: DC
  Administered 2017-02-05 – 2017-02-08 (×6): 0.5 mg via RESPIRATORY_TRACT
  Filled 2017-02-05 (×6): qty 2

## 2017-02-05 MED ORDER — ONDANSETRON HCL 4 MG PO TABS: 4.0000 mg | ORAL_TABLET | Freq: Four times a day (QID) | ORAL | Status: DC | PRN

## 2017-02-05 MED ORDER — FUROSEMIDE 10 MG/ML IJ SOLN
40.0000 mg | Freq: Once | INTRAMUSCULAR | Status: AC
  Administered 2017-02-05: 40 mg via INTRAVENOUS
  Filled 2017-02-05: qty 4

## 2017-02-05 MED ORDER — ENOXAPARIN SODIUM 30 MG/0.3ML ~~LOC~~ SOLN
30.0000 mg | SUBCUTANEOUS | Status: DC
  Administered 2017-02-05: 30 mg via SUBCUTANEOUS
  Filled 2017-02-05: qty 0.3

## 2017-02-05 MED ORDER — SODIUM CHLORIDE 0.9% FLUSH
3.0000 mL | Freq: Two times a day (BID) | INTRAVENOUS | Status: DC
  Administered 2017-02-05 – 2017-02-08 (×6): 3 mL via INTRAVENOUS

## 2017-02-05 MED ORDER — VITAMIN D 1000 UNITS PO TABS
1000.0000 [IU] | ORAL_TABLET | Freq: Every day | ORAL | Status: DC
  Administered 2017-02-06 – 2017-02-08 (×3): 1000 [IU] via ORAL
  Filled 2017-02-05 (×2): qty 1

## 2017-02-05 MED ORDER — CARVEDILOL 3.125 MG PO TABS
3.1250 mg | ORAL_TABLET | Freq: Two times a day (BID) | ORAL | Status: DC
  Administered 2017-02-05 – 2017-02-08 (×5): 3.125 mg via ORAL
  Filled 2017-02-05 (×6): qty 1

## 2017-02-05 NOTE — H&P (Signed)
[facility] [place] [facility]   PATIENT NAME: Kellie SalisburyLucy Patel    MR#:  [id number]  DATE OF BIRTH:  01/24/1926  DATE OF ADMISSION:  02/05/2017  PRIMARY CARE PHYSICIAN: BABAOFF, Lavada MesiMARC E, MD   REQUESTING/REFERRING PHYSICIAN: Dr. Nita Sicklearolina Veronese  CHIEF COMPLAINT:   Chief Complaint  Patient presents with  . Altered Mental Status    HISTORY OF PRESENT ILLNESS:  [name]  is a 83 y.o. female with a known history of Ruptured brain aneurysm status post clipping long time ago, recent admission 2 weeks ago for diastolic CHF exacerbation, history of pulmonary fibrosis on 2 L home oxygen is brought in today secondary to the onset of acute confusion since last night. Patient is alert oriented to self, gets confused easily and needs 3 orientation. Family at bedside provides most of the history. Prior to her last admission about a month ago, patient was very independent and was able to care for herself. During the last admission she was noted to have occasional confusion and was thought to be delirium. She was oriented 3 but according to family she was never her baseline at the time of discharge. They have noticed some short-term memory losses recently. Denies any prior history of dementia. Since yesterday patient was noted to be acutely confused. No new changes in medications. She has been on a prednisone taper. But she has taken prednisone in the past 2 without any trouble. Lasix is one of the new medication that was started during last admission. He deny any fevers or chills, nausea or vomiting. No head trauma. She couldn't perform routine activities like she did not remember what to do with her hands when food was placed in front of her, she didn't know if she has already used the bathroom are not and repeating the same questions again and again. And her start process is also very slow. Her urinalysis and chest x-ray are negative without any evidence of infection in the emergency  room. She could not get an MRI due to prior aneurysm clip in the brain. She is being admitted for acute confusion  PAST MEDICAL HISTORY:   Past Medical History:  Diagnosis Date  . Brain aneurysm 1998  . CHF (congestive heart failure) (HCC)    diastolic  . Pancreatitis   . Pulmonary fibrosis (HCC)     PAST SURGICAL HISTORY:   Past Surgical History:  Procedure Laterality Date  . APPENDECTOMY    . clipping of ruptured brain aneurysm      SOCIAL HISTORY:   Social History  Substance Use Topics  . Smoking status: Never Smoker  . Smokeless tobacco: Never Used  . Alcohol use No    FAMILY HISTORY:  History reviewed. No pertinent family history.  No family history obtained as patient is confused  DRUG ALLERGIES:  No Known Allergies  REVIEW OF SYSTEMS:   Review of Systems  Unable to perform ROS: Mental status change    MEDICATIONS AT HOME:   Prior to Admission medications   Medication Sig Start Date End Date Taking? Authorizing Provider  Budesonide (PULMICORT FLEXHALER) 90 MCG/ACT inhaler Inhale 1 puff into the lungs 2 (two) times daily.   Yes Historical Provider, MD  cetirizine (ZYRTEC) 10 MG tablet Take 10 mg by mouth daily.   Yes Historical Provider, MD  cholecalciferol (VITAMIN D) 1000 units tablet Take 1,000 Units by mouth daily.   Yes Historical Provider, MD  furosemide (LASIX) 40 MG tablet Take 40 mg by mouth  daily.   Yes Historical Provider, MD  Multiple Vitamins-Minerals (PRESERVISION AREDS) TABS Take 1 tablet by mouth 2 (two) times daily.   Yes Historical Provider, MD  omeprazole (PRILOSEC) 20 MG capsule Take 20 mg by mouth daily.   Yes Historical Provider, MD  potassium chloride SA (K-DUR,KLOR-CON) 20 MEQ tablet Take 20 mEq by mouth once.   Yes Historical Provider, MD  predniSONE (DELTASONE) 5 MG tablet Take 5 mg by mouth daily with breakfast. 10 pills POfor 3 days 8 pills PO 3 days 6 PO pills 3 days, 4 pills PO 3 days, 3 pills PO 3 days, 2 pills PO 3 days, 1  pills PO 3 days, 1/2 pills PO 3 days then STOP   Yes Historical Provider, MD      VITAL SIGNS:  Blood pressure 114/86, pulse 84, temperature 97.6 F (36.4 C), temperature source Oral, resp. rate 19, height 4\' 11"  (1.499 m), weight 73.9 kg (163 lb), SpO2 98 %.  PHYSICAL EXAMINATION:   Physical Exam  GENERAL:  83 y.o.-year-old patient lying in the bed with no acute distress.  EYES: Pupils equal, round, reactive to light and accommodation. No scleral icterus. Extraocular muscles intact.  HEENT: Head atraumatic, normocephalic. Oropharynx and nasopharynx clear.  NECK:  Supple, no jugular venous distention. No thyroid enlargement, no tenderness.  LUNGS: Normal breath sounds bilaterally, no wheezing, rales,rhonchi or crepitation. No use of accessory muscles of respiration.  CARDIOVASCULAR: S1, S2 normal. No rubs, or gallops. 3/6 systolic murmur heard  ABDOMEN: Soft, nontender, nondistended. Bowel sounds present. No organomegaly or mass.  EXTREMITIES: No cyanosis, or clubbing. 1+ pedal edema noted NEUROLOGIC: Cranial nerves II through XII are intact. Muscle strength 5/5 in all extremities. Sensation intact. Gait not checked. Global weakness noted. PSYCHIATRIC: The patient is alert and oriented x 1-2.  SKIN: No obvious rash, lesion, or ulcer.   LABORATORY PANEL:   CBC  Recent Labs Lab 02/05/17 0955  WBC 12.0*  HGB 15.7  HCT 48.7*  PLT 155   ------------------------------------------------------------------------------------------------------------------  Chemistries   Recent Labs Lab 02/05/17 0955  NA 142  K 4.1  CL 96*  CO2 39*  GLUCOSE 117*  BUN 34*  CREATININE 1.18*  CALCIUM 9.3  AST 30  ALT 25  ALKPHOS 55  BILITOT 1.4*   ------------------------------------------------------------------------------------------------------------------  Cardiac Enzymes  Recent Labs Lab 02/05/17 0955  TROPONINI <0.03    ------------------------------------------------------------------------------------------------------------------  RADIOLOGY:  Dg Chest 2 View  Result Date: 02/05/2017 CLINICAL DATA:  For altered mental status EXAM: CHEST  2 VIEW COMPARISON:  01/22/2017 FINDINGS: Moderate cardiomegaly. Low volumes. Fibrotic changes throughout both lungs are stable. There is dense opacity at the left base which is stable and related to elevation of the left hemidiaphragm the. No pneumothorax. No pleural effusion. IMPRESSION: Chronic changes.  No significant change. Electronically Signed   By: Jolaine Click M.D.   On: 02/05/2017 10:31   Ct Head Wo Contrast  Result Date: 02/05/2017 CLINICAL DATA:  Altered mental status, history of aneurysm clipping EXAM: CT HEAD WITHOUT CONTRAST TECHNIQUE: Contiguous axial images were obtained from the base of the skull through the vertex without intravenous contrast. COMPARISON:  None. FINDINGS: Brain: Mild atrophic changes are noted. Chronic white matter ischemic change is seen. No findings to suggest acute hemorrhage, acute infarction or space-occupying mass lesion are noted. Considerable scatter artifact is noted from prior aneurysm clipping. Vascular: No hyperdense vessel or unexpected calcification. Skull: Right craniotomy is noted consistent with the history of aneurysm clipping. Sinuses/Orbits: No acute finding. Other:  None. IMPRESSION: Chronic atrophic and ischemic changes without acute abnormality. Postsurgical changes are noted.  No acute abnormality seen. Electronically Signed   By: Alcide Clever M.D.   On: 02/05/2017 10:19    EKG:   Orders placed or performed during the hospital encounter of 02/05/17  . ED EKG  . ED EKG    IMPRESSION AND PLAN:   [name]  is a 83 y.o. female with a known history of Ruptured brain aneurysm status post clipping long time ago, recent admission 2 weeks ago for diastolic CHF exacerbation, history of pulmonary fibrosis on 2 L home  oxygen is brought in today secondary to the onset of acute confusion since last night.  #1 acute confusion-metabolic encephalopathy or acute stroke cannot be ruled out. -No other focal deficits. Admit, cannot get an MRI or MRA of the brain due to her aneurysm clip. -Neuro checks, neurology consult. -Repeat CT head in a.m. Infection has been ruled out so far. No fevers, has slightly elevated white count. But urine analysis and chest x-ray are negative for infection  #2 chronic diastolic CHF-continue Lasix at this time. Also on potassium supplements. She has lost most of her fluid weight.  #3 pulmonary fibrosis-continue 2 L oxygen support and monitor. We'll discontinue steroids as there is no acute indication at this time.  #4 GERD-on Protonix  #5 DVT prophylaxis-on Lovenox  Physical therapy consulted Prior to discharge   All the records are reviewed and case discussed with ED provider. Management plans discussed with the patient, family and they are in agreement.  CODE STATUS: DO NOT RESUSCITATE  TOTAL TIME TAKING CARE OF THIS PATIENT: 50 minutes.    Enid Baas M.D on 02/05/2017 at 1:53 PM  Between 7am to 6pm - Pager - [phone]  After 6pm go to [email or web address] - password [email or web address]  [facility]  Office  [phone]  CC: Primary care physician; BABAOFF, Lavada Mesi, MD

## 2017-02-05 NOTE — Progress Notes (Signed)
CCMD notified this RN of quadreminy PVC's. MD notified, orders received. Otilio JeffersonMadelyn S Fenton, RN

## 2017-02-05 NOTE — ED Provider Notes (Signed)
[facility] Emergency Department Provider Note  ____________________________________________  Time seen: Approximately 11:27 AM  I have reviewed the triage vital signs and the nursing notes.   HISTORY  Chief Complaint Altered Mental Status   HPI [name] is a 83 y.o. female with a history of pulmonary fibrosis, CHF, and brain aneurysm who presents for evaluation of altered mental status. History is gathered from patient's daughter-in-law. Patient has been living with her for 20 years since having a brain aneurysm complicated by an intracranial hemorrhage. Since then patient has had short-term memory loss but according to her daughter-in-law patient is very independent. She dresses herself, feeds herself, is ambulatory with a walker and has normal mental status at baseline. She was discharged from the hospital 2 weeks ago for CHF exacerbation and since then her mental status has been declining. This morning her daughter-in-law found her on the floor of her bedroom patient was extremely confused. Patient did not know what to do with her medications and when placed in her mouth she was spitting them up which is very atypical for her. They try to give her coffee and breakfast and patient didn't know what to do with the food, utensils, and a cup of coffee. She was very disoriented and did not know where she was, the year, or her date of birth which is also atypical for the patient. Patient does not remember her fall. When I ask her why she is here she tells me that is because she is hungry. She was able to recall her date of birth but had extreme difficulty. She does not know the year.  Past Medical History:  Diagnosis Date  . Brain aneurysm 1998  . CHF (congestive heart failure) (HCC)    diastolic  . Pancreatitis   . Pulmonary fibrosis [facility](HCC)     Patient Active Problem List   Diagnosis Date Noted  . Acute confusion 02/05/2017  . Chest pain 01/29/2017  . Acute  on chronic respiratory failure with hypoxia (HCC) 01/23/2017  . Leg edema 01/23/2017  . Acute renal failure (HCC) 01/23/2017  . Metabolic encephalopathy 01/23/2017  . Pulmonary fibrosis (HCC) 01/23/2017  . Heart failure (HCC) 01/17/2017  . Acute diastolic congestive heart failure (HCC) 01/15/2017    Past Surgical History:  Procedure Laterality Date  . APPENDECTOMY    . clipping of ruptured brain aneurysm      Prior to Admission medications   Medication Sig Start Date End Date Taking? Authorizing Provider  Budesonide (PULMICORT FLEXHALER) 90 MCG/ACT inhaler Inhale 1 puff into the lungs 2 (two) times daily.   Yes Historical Provider, MD  cetirizine (ZYRTEC) 10 MG tablet Take 10 mg by mouth daily.   Yes Historical Provider, MD  cholecalciferol (VITAMIN D) 1000 units tablet Take 1,000 Units by mouth daily.   Yes Historical Provider, MD  furosemide (LASIX) 40 MG tablet Take 40 mg by mouth daily.   Yes Historical Provider, MD  Multiple Vitamins-Minerals (PRESERVISION AREDS) TABS Take 1 tablet by mouth 2 (two) times daily.   Yes Historical Provider, MD  omeprazole (PRILOSEC) 20 MG capsule Take 20 mg by mouth daily.   Yes Historical Provider, MD  potassium chloride SA (K-DUR,KLOR-CON) 20 MEQ tablet Take 20 mEq by mouth once.   Yes Historical Provider, MD  predniSONE (DELTASONE) 5 MG tablet Take 5 mg by mouth daily with breakfast. 10 pills POfor 3 days 8 pills PO 3 days 6 PO pills 3 days, 4 pills PO 3 days, 3  pills PO 3 days, 2 pills PO 3 days, 1 pills PO 3 days, 1/2 pills PO 3 days then STOP   Yes Historical Provider, MD    Allergies Patient has no known allergies.  History reviewed. No pertinent family history.  Social History Social History  Substance Use Topics  . Smoking status: Never Smoker  . Smokeless tobacco: Never Used  . Alcohol use No    Review of Systems Constitutional: Negative for fever. + confused Eyes: Negative for visual changes. ENT: Negative for facial injury or  neck injury Cardiovascular: Negative for chest injury. Respiratory: Negative for shortness of breath. Negative for chest wall injury. Gastrointestinal: Negative for abdominal pain or injury. Genitourinary: Negative for dysuria. Musculoskeletal: Negative for back injury, negative for arm or leg pain. Skin: Negative for laceration/abrasions. Bruises noted on the LLE Neurological: Negative for head injury.  ____________________________________________   PHYSICAL EXAM:  VITAL SIGNS: ED Triage Vitals  Enc Vitals Group     BP 02/05/17 0928 123/89     Pulse Rate 02/05/17 0928 95     Resp 02/05/17 0928 19     Temp 02/05/17 0928 97.6 F (36.4 C)     Temp Source 02/05/17 0928 Oral     SpO2 02/05/17 0928 91 %     Weight 02/05/17 0933 163 lb (73.9 kg)     Height 02/05/17 0933 4\' 11"  (1.499 m)     Head Circumference --      Peak Flow --      Pain Score --      Pain Loc --      Pain Edu? --      Excl. in GC? --    Constitutional: Alert and oriented x 2. No acute distress. Does not appear intoxicated. HEENT Head: Normocephalic and atraumatic. Face: No facial bony tenderness. Stable midface Ears: No hemotympanum bilaterally. No Battle sign Eyes: No eye injury. PERRL. No raccoon eyes Nose: Nontender. No epistaxis. No rhinorrhea Mouth/Throat: Mucous membranes are moist. No oropharyngeal blood. No dental injury. Airway patent without stridor. Normal voice. Neck: no C-collar in place. No midline c-spine tenderness.  Cardiovascular: Normal rate, regular rhythm. Normal and symmetric distal pulses are present in all extremities. Pulmonary/Chest: Chest wall is stable and nontender to palpation/compression. Normal respiratory effort. Breath sounds are normal. No crepitus.  Abdominal: Soft, nontender, non distended. Musculoskeletal: Nontender with normal full range of motion in all extremities. No deformities. No thoracic or lumbar midline spinal tenderness. Pelvis is stable. Skin: Skin is warm,  dry and intact. Bruises noted on the LLE Neurological: Normal speech and language. Moves all extremities to command. No gross focal neurologic deficits are appreciated. No pronator drift, intact strength 4  Glascow Coma Score: 4 - Opens eyes on own 6 - Follows simple motor commands 4 - Seems confused, disoriented GCS: 14   ____________________________________________   LABS (all labs ordered are listed, but only abnormal results are displayed)  Labs Reviewed  COMPREHENSIVE METABOLIC PANEL - Abnormal; Notable for the following:       Result Value   Chloride 96 (*)    CO2 39 (*)    Glucose, Bld 117 (*)    BUN 34 (*)    Creatinine, Ser 1.18 (*)    Total Bilirubin 1.4 (*)    GFR calc non Af Amer 40 (*)    GFR calc Af Amer 46 (*)    All other components within normal limits  CBC - Abnormal; Notable for the following:    WBC  12.0 (*)    HCT 48.7 (*)    RDW 14.8 (*)    All other components within normal limits  URINALYSIS, COMPLETE (UACMP) WITH MICROSCOPIC - Abnormal; Notable for the following:    Color, Urine YELLOW (*)    APPearance CLEAR (*)    All other components within normal limits  BRAIN NATRIURETIC PEPTIDE - Abnormal; Notable for the following:    B Natriuretic Peptide 1,217.0 (*)    All other components within normal limits  TROPONIN I   ____________________________________________  EKG  ED ECG REPORT I, Nita Sickle, the attending physician, personally viewed and interpreted this ECG.  Normal sinus rhythm, rate of 90, right bundle branch block, normal QTC, right axis deviation, occasional PVCs, T-wave inversions in inferior and anterior leads, no ST elevation. No significant changes when compared to EKG done a week ago. ____________________________________________  RADIOLOGY  Head CT; negative  CXR: Negative ____________________________________________   PROCEDURES  Procedure(s) performed: None Procedures Critical Care performed:   None ____________________________________________   INITIAL IMPRESSION / ASSESSMENT AND PLAN / ED COURSE  83 y.o. female with a history of pulmonary fibrosis, CHF, and brain aneurysm who presents for evaluation of altered mental status which has been declining slowly for 2 weeks but severe change since last night. Patient has a GCS of 14. She is confused and does not know why she is here. She has no complaints. Her physical exam is grossly intact. Her vital signs are within normal limits. Blood work shows slightly elevated creatinine at 1.18 (baseline is 0.99) mild leukocytosis with white count of 12, no evidence of UTI. Chest x-ray with no evidence of pulmonary edema or pneumonia. Head CT with no acute findings. EKG with no ischemic changes. Troponin pending. Unclear etiology of patient's delirium. Will admit for further evaluation.  Clinical Course as of Feb 06 1511  Fri Feb 05, 2017  1208 BNP is elevated at 1217 however patient has clear lungs and no shortness of breath. Therefore I am not sure this is the etiology of her delirium. Remaining of her blood work including troponin, urinalysis, CBC, CMP are all within normal. Chest x-ray and head CT with no acute findings. Patient will be admitted for delirium to the hospitalist service.  [CV]    Clinical Course User Index [CV] Nita Sickle, MD    Pertinent labs & imaging results that were available during my care of the patient were reviewed by me and considered in my medical decision making (see chart for details).    ____________________________________________   FINAL CLINICAL IMPRESSION(S) / ED DIAGNOSES  Final diagnoses:  Delirium      NEW MEDICATIONS STARTED DURING THIS VISIT:  New Prescriptions   No medications on file     Note:  This document was prepared using Dragon voice recognition software and may include unintentional dictation errors.    Nita Sickle, MD 02/05/17 [phone]

## 2017-02-05 NOTE — ED Triage Notes (Signed)
Patient arrived by EMS from home where she lives with daughter. Daughter reports finding her in her bedroom floor this AM. Daughter reports once getting her up and getting to the breakfast table she had AMS. Normal baseline is no confusion

## 2017-02-05 NOTE — Progress Notes (Signed)
PHARMACIST - PHYSICIAN COMMUNICATION  CONCERNING:  Enoxaparin (Lovenox) for DVT Prophylaxis    RECOMMENDATION: Patient was prescribed enoxaprin 40mg  q24 hours for VTE prophylaxis.   Filed Weights   02/05/17 0933  Weight: 163 lb (73.9 kg)    Body mass index is 32.92 kg/m.  Estimated Creatinine Clearance: 28.9 mL/min (A) (by C-G formula based on SCr of 1.18 mg/dL (H)).   Patient is candidate for enoxaparin 30mg  every 24 hours based on CrCl <8430ml/min   DESCRIPTION: Pharmacy has adjusted enoxaparin dose.  Patient is now receiving enoxaparin 30mg  every 24 hours.  Cher NakaiSheema Vivienne Sangiovanni, PharmD Clinical Pharmacist  02/05/2017 4:19 PM

## 2017-02-06 ENCOUNTER — Inpatient Hospital Stay: Payer: Medicare Other

## 2017-02-06 DIAGNOSIS — R410 Disorientation, unspecified: Secondary | ICD-10-CM

## 2017-02-06 LAB — BASIC METABOLIC PANEL
Anion gap: 7 (ref 5–15)
BUN: 29 mg/dL — ABNORMAL HIGH (ref 6–20)
CALCIUM: 9 mg/dL (ref 8.9–10.3)
CO2: 38 mmol/L — AB (ref 22–32)
CREATININE: 0.92 mg/dL (ref 0.44–1.00)
Chloride: 92 mmol/L — ABNORMAL LOW (ref 101–111)
GFR, EST NON AFRICAN AMERICAN: 54 mL/min — AB (ref >60)
Glucose, Bld: 97 mg/dL (ref 65–99)
Potassium: 3.8 mmol/L (ref 3.5–5.1)
SODIUM: 137 mmol/L (ref 135–145)

## 2017-02-06 LAB — CBC
HCT: 48 % — ABNORMAL HIGH (ref 35.0–47.0)
Hemoglobin: 15.8 g/dL (ref 12.0–16.0)
MCH: 31.4 pg (ref 26.0–34.0)
MCHC: 33 g/dL (ref 32.0–36.0)
MCV: 95 fL (ref 80.0–100.0)
Platelets: 139 10*3/uL — ABNORMAL LOW (ref 150–440)
RBC: 5.05 MIL/uL (ref 3.80–5.20)
RDW: 14.1 % (ref 11.5–14.5)
WBC: 9.8 10*3/uL (ref 3.6–11.0)

## 2017-02-06 LAB — TSH: TSH: 0.407 u[IU]/mL (ref 0.350–4.500)

## 2017-02-06 LAB — VITAMIN B12: Vitamin B-12: 379 pg/mL (ref 180–914)

## 2017-02-06 LAB — AMMONIA: AMMONIA: 14 umol/L (ref 9–35)

## 2017-02-06 MED ORDER — PREDNISONE 5 MG/5ML PO SOLN
2.5000 mg | Freq: Every day | ORAL | Status: AC
  Administered 2017-02-06 – 2017-02-08 (×3): 2.5 mg via ORAL
  Filled 2017-02-06 (×3): qty 2.5

## 2017-02-06 MED ORDER — ASPIRIN EC 81 MG PO TBEC
81.0000 mg | DELAYED_RELEASE_TABLET | Freq: Every day | ORAL | Status: DC
  Administered 2017-02-06 – 2017-02-08 (×3): 81 mg via ORAL
  Filled 2017-02-06 (×3): qty 1

## 2017-02-06 MED ORDER — ENOXAPARIN SODIUM 40 MG/0.4ML ~~LOC~~ SOLN
40.0000 mg | SUBCUTANEOUS | Status: DC
  Administered 2017-02-06 – 2017-02-07 (×2): 40 mg via SUBCUTANEOUS
  Filled 2017-02-06 (×2): qty 0.4

## 2017-02-06 NOTE — NC FL2 (Signed)
[place] MEDICAID FL2 LEVEL OF CARE SCREENING TOOL     IDENTIFICATION  Patient Name: [name] Birthdate: 07/13/1926 Sex: female Admission Date (Current Location): 02/05/2017  [place]   Facility and Address:  [facility], [address], [place], [place] [address]      Provider Number: [id number]  Attending Physician Name and Address:  Enedina FinnerSona Patel, MD  Relative Name and Phone Number:       Current Level of Care: Hospital Recommended Level of Care: Skilled Nursing Facility Prior Approval Number:    Date Approved/Denied:   PASRR Number:  [id number] )  Discharge Plan: SNF    Current Diagnoses: Patient Active Problem List   Diagnosis Date Noted  . Acute confusion 02/05/2017  . Chest pain 01/29/2017  . Acute on chronic respiratory failure with hypoxia (HCC) 01/23/2017  . Leg edema 01/23/2017  . Acute renal failure (HCC) 01/23/2017  . Metabolic encephalopathy 01/23/2017  . Pulmonary fibrosis (HCC) 01/23/2017  . Heart failure (HCC) 01/17/2017  . Acute diastolic congestive heart failure (HCC) 01/15/2017    Orientation RESPIRATION BLADDER Height & Weight     Self  O2 (2 Liters Oxygen ) Incontinent Weight: 163 lb (73.9 kg) Height:  4\' 11"  (149.9 cm)  BEHAVIORAL SYMPTOMS/MOOD NEUROLOGICAL BOWEL NUTRITION STATUS   (none)  (history of brain aneurysm) Incontinent Diet (Diet: Heart Healthy )  AMBULATORY STATUS COMMUNICATION OF NEEDS Skin   Extensive Assist Verbally Normal                       Personal Care Assistance Level of Assistance  Bathing, Feeding, Dressing Bathing Assistance: Limited assistance Feeding assistance: Independent Dressing Assistance: Limited assistance     Functional Limitations Info  Sight, Hearing, Speech Sight Info: Adequate Hearing Info: Adequate Speech Info: Adequate    SPECIAL CARE FACTORS FREQUENCY  PT (By licensed PT), OT (By licensed OT)     PT Frequency:  (5) OT  Frequency:  (5)            Contractures      Additional Factors Info  Code Status, Allergies Code Status Info:  (DNR ) Allergies Info:  (No Known Allergies. )           Current Medications (02/06/2017):  This is the current hospital active medication list Current Facility-Administered Medications  Medication Dose Route Frequency Provider Last Rate Last Dose  . acetaminophen (TYLENOL) tablet 650 mg  650 mg Oral Q6H PRN Enid Baasadhika Kalisetti, MD       Or  . acetaminophen (TYLENOL) suppository 650 mg  650 mg Rectal Q6H PRN Enid Baasadhika Kalisetti, MD      . beta carotene w/minerals (OCUVITE) tablet 1 tablet  1 tablet Oral BID Enid Baasadhika Kalisetti, MD   1 tablet at 02/06/17 0824  . budesonide (PULMICORT) nebulizer solution 0.5 mg  0.5 mg Inhalation BID Enid Baasadhika Kalisetti, MD   0.5 mg at 02/06/17 0726  . carvedilol (COREG) tablet 3.125 mg  3.125 mg Oral BID WC Enid Baasadhika Kalisetti, MD   3.125 mg at 02/06/17 0824  . cholecalciferol (VITAMIN D) tablet 1,000 Units  1,000 Units Oral Daily Enid Baasadhika Kalisetti, MD   1,000 Units at 02/06/17 0824  . docusate sodium (COLACE) capsule 100 mg  100 mg Oral BID Enid Baasadhika Kalisetti, MD   100 mg at 02/06/17 0824  . enoxaparin (LOVENOX) injection 30 mg  30 mg Subcutaneous Q24H Enid Baasadhika Kalisetti, MD   30 mg at 02/05/17 2134  .  furosemide (LASIX) tablet 40 mg  40 mg Oral Daily Enid Baas, MD   40 mg at 02/06/17 0824  . ondansetron (ZOFRAN) tablet 4 mg  4 mg Oral Q6H PRN Enid Baas, MD       Or  . ondansetron (ZOFRAN) injection 4 mg  4 mg Intravenous Q6H PRN Enid Baas, MD      . pantoprazole (PROTONIX) EC tablet 40 mg  40 mg Oral Daily Enid Baas, MD   40 mg at 02/06/17 0824  . potassium chloride SA (K-DUR,KLOR-CON) CR tablet 20 mEq  20 mEq Oral Daily Enid Baas, MD   20 mEq at 02/06/17 0824  . predniSONE 5 MG/5ML solution 2.5 mg  2.5 mg Oral Q breakfast Enedina Finner, MD      . sodium chloride flush (NS) 0.9 % injection 3 mL  3 mL Intravenous  Q12H Enid Baas, MD   3 mL at 02/06/17 1000     Discharge Medications: Please see discharge summary for a list of discharge medications.  Relevant Imaging Results:  Relevant Lab Results:   Additional Information  (SSN: [id number])  Mellany Dinsmore, Darleen Crocker, LCSW

## 2017-02-06 NOTE — Progress Notes (Signed)
Anticoagulation monitoring(Lovenox):  83yo  F ordered Lovenox 30 mg Q24h  Filed Weights   02/05/17 0933  Weight: 163 lb (73.9 kg)   BMI 33   Lab Results  Component Value Date   CREATININE 0.92 02/06/2017   CREATININE 1.18 (H) 02/05/2017   CREATININE 0.99 01/29/2017   Estimated Creatinine Clearance: 37 mL/min (by C-G formula based on SCr of 0.92 mg/dL). Hemoglobin & Hematocrit     Component Value Date/Time   HGB 15.8 02/06/2017 0441   HGB 14.9 03/30/2014 1933   HCT 48.0 (H) 02/06/2017 0441   HCT 47.0 03/30/2014 1933     Per Protocol for Patient with estCrcl now > 30 ml/min and BMI < 40, will transition to Lovenox 40 mg Q24h     Bari MantisKristin Iley Breeden PharmD Clinical Pharmacist 02/06/2017

## 2017-02-06 NOTE — Clinical Social Work Placement (Signed)
   CLINICAL SOCIAL WORK PLACEMENT  NOTE  Date:  02/06/2017  Patient Details  Name: [name] MRN: [id number] Date of Birth: 10/16/1926  Clinical Social Work is seeking post-discharge placement for this patient at the Skilled  Nursing Facility level of care (*CSW will initial, date and re-position this form in  chart as items are completed):  Yes   Patient/family provided with [place] Clinical Social Work Department's list of facilities offering this level of care within the geographic area requested by the patient (or if unable, by the patient's family).  Yes   Patient/family informed of their freedom to choose among providers that offer the needed level of care, that participate in Medicare, Medicaid or managed care program needed by the patient, have an available bed and are willing to accept the patient.  Yes   Patient/family informed of [facility]'s ownership interest in [facility] and [facility], as well as of the fact that they are under no obligation to receive care at these facilities.  PASRR submitted to EDS on       PASRR number received on       Existing PASRR number confirmed on 02/06/17     FL2 transmitted to all facilities in geographic area requested by pt/family on 02/06/17     FL2 transmitted to all facilities within larger geographic area on       Patient informed that his/her managed care company has contracts with or will negotiate with certain facilities, including the following:            Patient/family informed of bed offers received.  Patient chooses bed at       Physician recommends and patient chooses bed at      Patient to be transferred to   on  .  Patient to be transferred to facility by       Patient family notified on   of transfer.  Name of family member notified:        PHYSICIAN       Additional Comment:    _______________________________________________ Cela Newcom, Darleen CrockerBailey M, LCSW 02/06/2017, 10:53 AM

## 2017-02-06 NOTE — Progress Notes (Addendum)
[facility] [place] [facility]   PATIENT NAME: [name]    MR#:  [id number]  DATE OF BIRTH:  1926-07-03  SUBJECTIVE:  Most of the history is obtained from daughter who is at bedside. Brought in with acute confusion. Patient is awake alert to time and place. She's been having issues with memory. No hallucinations. No falls. No head trauma.  REVIEW OF SYSTEMS:   Review of Systems  Unable to perform ROS: Medical condition   Tolerating Diet:yes Tolerating PT: Pending  DRUG ALLERGIES:  No Known Allergies  VITALS:  Blood pressure 113/61, pulse 91, temperature 97.5 F (36.4 C), temperature source Oral, resp. rate 20, height 4\' 11"  (1.499 m), weight 73.9 kg (163 lb), SpO2 96 %.  PHYSICAL EXAMINATION:   Physical Exam  GENERAL:  83 y.o.-year-old patient lying in the bed with no acute distress.  EYES: Pupils equal, round, reactive to light and accommodation. No scleral icterus. Extraocular muscles intact.  HEENT: Head atraumatic, normocephalic. Oropharynx and nasopharynx clear.  NECK:  Supple, no jugular venous distention. No thyroid enlargement, no tenderness.  LUNGS: Normal breath sounds bilaterally, no wheezing, rales, rhonchi. No use of accessory muscles of respiration.  CARDIOVASCULAR: S1, S2 normal. No murmurs, rubs, or gallops.  ABDOMEN: Soft, nontender, nondistended. Bowel sounds present. No organomegaly or mass.  EXTREMITIES: No cyanosis, clubbing or edema b/l.    NEUROLOGIC:  No focal Motor or sensory deficits b/l.  Grossly nonfocal. She has had some memory issues. PSYCHIATRIC:  patient is alert and oriented x 3.  SKIN: No obvious rash, lesion, or ulcer.   LABORATORY PANEL:  CBC  Recent Labs Lab 02/06/17 0441  WBC 9.8  HGB 15.8  HCT 48.0*  PLT 139*    Chemistries   Recent Labs Lab 02/05/17 0955 02/06/17 0441  NA 142 137  K 4.1 3.8  CL 96* 92*  CO2 39* 38*  GLUCOSE 117* 97  BUN 34* 29*  CREATININE 1.18* 0.92  CALCIUM 9.3  9.0  MG 1.6*  --   AST 30  --   ALT 25  --   ALKPHOS 55  --   BILITOT 1.4*  --    Cardiac Enzymes  Recent Labs Lab 02/05/17 0955  TROPONINI <0.03   RADIOLOGY:  Dg Chest 2 View  Result Date: 02/05/2017 CLINICAL DATA:  For altered mental status EXAM: CHEST  2 VIEW COMPARISON:  01/22/2017 FINDINGS: Moderate cardiomegaly. Low volumes. Fibrotic changes throughout both lungs are stable. There is dense opacity at the left base which is stable and related to elevation of the left hemidiaphragm the. No pneumothorax. No pleural effusion. IMPRESSION: Chronic changes.  No significant change. Electronically Signed   By: Jolaine Click M.D.   On: 02/05/2017 10:31   Ct Head Wo Contrast  Result Date: 02/06/2017 CLINICAL DATA:  Confusion. EXAM: CT HEAD WITHOUT CONTRAST TECHNIQUE: Contiguous axial images were obtained from the base of the skull through the vertex without intravenous contrast. COMPARISON:  February 05, 2017 FINDINGS: Brain: No subdural, epidural, or subarachnoid hemorrhage. No mass, mass effect, or midline shift. Ventricles and sulci are prominent but stable. Cerebellum, brainstem, and basal cisterns are unremarkable. Moderate to severe white matter changes are stable. No acute cortical ischemia or infarct is identified. No mass, mass effect, or midline shift. Vascular: Previous aneurysm repair with a clip in the suprasellar cistern, unchanged. This results in significant artifact limiting further evaluation. Skull: Stable right craniotomy, healed. No acute bony abnormalities. Sinuses/Orbits: No acute finding. Other:  None. IMPRESSION: No acute intracranial process identified. Chronic small vessel white matter changes. Electronically Signed   By: Gerome Samavid  Williams III M.D   On: 02/06/2017 11:55   Ct Head Wo Contrast  Result Date: 02/05/2017 CLINICAL DATA:  Altered mental status, history of aneurysm clipping EXAM: CT HEAD WITHOUT CONTRAST TECHNIQUE: Contiguous axial images were obtained from the base  of the skull through the vertex without intravenous contrast. COMPARISON:  None. FINDINGS: Brain: Mild atrophic changes are noted. Chronic white matter ischemic change is seen. No findings to suggest acute hemorrhage, acute infarction or space-occupying mass lesion are noted. Considerable scatter artifact is noted from prior aneurysm clipping. Vascular: No hyperdense vessel or unexpected calcification. Skull: Right craniotomy is noted consistent with the history of aneurysm clipping. Sinuses/Orbits: No acute finding. Other: None. IMPRESSION: Chronic atrophic and ischemic changes without acute abnormality. Postsurgical changes are noted.  No acute abnormality seen. Electronically Signed   By: Alcide CleverMark  Lukens M.D.   On: 02/05/2017 10:19   ASSESSMENT AND PLAN:  *Kellie SalisburyLucy Patel  is a 83 y.o. female with a known history of Ruptured brain aneurysm status post clipping long time ago, recent admission 2 weeks ago for diastolic CHF exacerbation, history of pulmonary fibrosis on 2 L home oxygen is brought in today secondary to the onset of acute confusion since last night.  #1 acute confusion-suspected acute stroke -No other focal deficits. -cannot get an MRI or MRA of the brain due to her aneurysm clip. -Neuro checks, neurology consult awiated.  -Repeat CT head today negative - Infection has been ruled out so far. No fevers, has slightly elevated white count. But urine analysis and chest x-ray are negative for infection -will give asa 81 mg daily  #2 chronic diastolic CHF-continue Lasix at this time. Also on potassium supplements. She has lost most of her fluid weight.  #3 pulmonary fibrosis-continue 2 L oxygen support and monitor. We'll discontinue steroids as there is no acute indication at this time.  #4 GERD-on Protonix  #5 DVT prophylaxis-on Lovenox  Physical therapy consulted Prior to discharge  Case discussed with Care Management/Social Worker. Management plans discussed with the patient, family  and they are in agreement.  CODE STATUS:DNR  TOTAL TIME TAKING CARE OF THIS PATIENT:30 minutes.  >50% time spent on counselling and coordination of care  POSSIBLE D/C IN 1-2 DAYS, DEPENDING ON CLINICAL CONDITION.  Note: This dictation was prepared with Dragon dictation along with smaller phrase technology. Any transcriptional errors that result from this process are unintentional.  Md Smola M.D on 02/06/2017 at 1:03 PM  Between 7am to 6pm - Pager - [phone]  After 6pm go to [email or web address] - password [employer] [email or web address]  [facility] [place] [facility]  Office  [phone]  CC: Primary care physician; BABAOFF, Lavada MesiMARC E, MD

## 2017-02-06 NOTE — Care Management Note (Signed)
Case Management Note  Patient Details  Name: Jobe IgoLucy V Salvo MRN: [id number] Date of Birth: 05/03/1926  Subjective/Objective:      Spoke with Angelica ChessmanMandy at [employer] who supplies Mrs Murphys liquid oxygen at home. Angelica ChessmanMandy will reach out and contact daughter [name] about her concerns regarding Mrs Murphys home oxygen which is supplied by [employer].               Action/Plan:   Expected Discharge Date:                  Expected Discharge Plan:     In-House Referral:     Discharge planning Services     Post Acute Care Choice:    Choice offered to:     DME Arranged:    DME Agency:     HH Arranged:    HH Agency:     Status of Service:     If discussed at [employer] Length of Stay Meetings, dates discussed:    Additional Comments:  Misaki Sozio A, RN 02/06/2017, 10:12 AM

## 2017-02-06 NOTE — Evaluation (Signed)
Physical Therapy Evaluation Patient Details Name: [name] MRN: [id number] DOB: [birth year] Today's Date: 02/06/2017   History of Present Illness  83 y.o. female with history of an aneurysm that is suspected to have progressed to stroke.  However, cannot do MRI to diagnose.  May be encephalopathy.  Clinical Impression  Pt is walking with PT on a RW, noted her O2 sats were fine but fatiguing and had to sit often.  Initially sidestepped side of bed then progressed to her walking from recliner to across room to door.  Her family was in attendance and talked with them about not overexerting as they were anxious to have her move a great deal.  Will continue on and attempt steps if possible but may not be ready until after SNF stay, as requested.  Has acute therapy needs for balance, strength and safety with gait on LRAD, as well as stairs if she is more promising to get home directly.    Follow Up Recommendations SNF    Equipment Recommendations  None recommended by PT    Recommendations for Other Services       Precautions / Restrictions Precautions Precautions: Fall (telemetry) Restrictions Weight Bearing Restrictions: No      Mobility  Bed Mobility Overal bed mobility: Needs Assistance Bed Mobility: Supine to Sit     Supine to sit: Mod assist     General bed mobility comments: pt reaches with her hands to anyone offering to help, educated family to support trunk  Transfers Overall transfer level: Needs assistance Equipment used: Rolling walker (2 wheeled);1 person hand held assist Transfers: Sit to/from [employer] Sit to Stand: Min assist Stand pivot transfers: Min assist       General transfer comment: reminders for safety and sequence  Ambulation/Gait Ambulation/Gait assistance: Min assist;+2 safety/equipment (close guard wc of a second person) Ambulation Distance (Feet): 12 Feet Assistive device: Rolling walker (2 wheeled);1 person hand held  assist Gait Pattern/deviations: Step-through pattern;Decreased stride length;Wide base of support;Trunk flexed Gait velocity: Decreased Gait velocity interpretation: Below normal speed for age/gender General Gait Details: short trip as pt fatigued by the multiple efforts to sidestep and transfer to finally get to recliner for safety of gait  Stairs            Wheelchair Mobility    Modified Rankin (Stroke Patients Only)       Balance     Sitting balance-Leahy Scale: Fair     Standing balance support: Bilateral upper extremity supported Standing balance-Leahy Scale: Poor                               Pertinent Vitals/Pain Pain Assessment: No/denies pain    Home Living Family/patient expects to be discharged to:: Skilled nursing facility Living Arrangements: Children Available Help at Discharge: Family;Available PRN/intermittently Type of Home: House Home Access: Stairs to enter   Entrance Stairs-Number of Steps: 1 Home Layout: One level Home Equipment: Walker - 2 wheels;Cane - single point;Shower seat - built in;Hand held shower head      Prior Function Level of Independence: Needs assistance   Gait / Transfers Assistance Needed: used a RW between rooms but had become unsteady  ADL's / Homemaking Assistance Needed: family care for home         Hand Dominance        Extremity/Trunk Assessment   Upper Extremity Assessment Upper Extremity Assessment: Generalized weakness    Lower Extremity  Assessment Lower Extremity Assessment: Generalized weakness    Cervical / Trunk Assessment Cervical / Trunk Assessment: Kyphotic  Communication   Communication: HOH  Cognition Arousal/Alertness: Awake/alert Behavior During Therapy: WFL for tasks assessed/performed Overall Cognitive Status: Within Functional Limits for tasks assessed                      General Comments      Exercises     Assessment/Plan    PT Assessment Patient  needs continued PT services  PT Problem List Decreased strength;Decreased range of motion;Decreased activity tolerance;Decreased balance;Decreased mobility;Decreased coordination;Decreased knowledge of use of DME;Decreased safety awareness;Cardiopulmonary status limiting activity;Obesity       PT Treatment Interventions DME instruction;Functional mobility training;Therapeutic activities;Stair training;Gait training;Balance training;Therapeutic exercise;Neuromuscular re-education;Patient/family education    PT Goals (Current goals can be found in the Care Plan section)  Acute Rehab PT Goals Patient Stated Goal: none stated PT Goal Formulation: With family Time For Goal Achievement: 02/20/17 Potential to Achieve Goals: Good    Frequency Min 2X/week   Barriers to discharge Inaccessible home environment (stair to enter house)      Co-evaluation               End of Session Equipment Utilized During Treatment: Oxygen;Gait belt Activity Tolerance: Patient tolerated treatment well Patient left: in chair;with call bell/phone within reach;with chair alarm set;with family/visitor present Nurse Communication: Mobility status PT Visit Diagnosis: Muscle weakness (generalized) (M62.81);Unsteadiness on feet (R26.81)    Functional Assessment Tool Used: AM-PAC 6 Clicks Basic Mobility    Time: [id number] PT Time Calculation (min) (ACUTE ONLY): 34 min   Charges:   PT Evaluation $PT Eval Moderate Complexity: 1 Procedure PT Treatments $Gait Training: 8-22 mins   PT G Codes:   PT G-Codes **NOT FOR INPATIENT CLASS** Functional Assessment Tool Used: AM-PAC 6 Clicks Basic Mobility     Ivar DrapeRuth E Aanya Haynes 02/06/2017, 6:22 PM   Samul Dadauth Karam Dunson, PT MS Acute Rehab Dept. Number: [facility] [phone] and [facility] [phone]

## 2017-02-06 NOTE — Plan of Care (Signed)
Problem: Bowel/Gastric: Goal: Will not experience complications related to bowel motility Outcome: Progressing Alert with intermittent confusion, family verbalizes change from patient's baseline, repeat CT unremarkable, patient not a candidate for MRI, incontinent of urine, patient's level of cognition has improved throughout shift, she was a feeder at beginning of shift and at dinner she was able to feed herself and request bedpan. Denies pain, vitals stable, family at bedside, family spoke with Internal med and neurologist, neuro made mediation changes.

## 2017-02-06 NOTE — Clinical Social Work Note (Signed)
Clinical Social Work Assessment  Patient Details  Name: [name] MRN: [id number] Date of Birth: 06/15/1926  Date of referral:  02/06/17               Reason for consult:  Facility Placement                Permission sought to share information with:  Facility Contact Representative Permission granted to share information::  Yes, Verbal Permission Granted  Name::      Skilled Nursing Facility   Agency::   [place]   Relationship::     Contact Information:     Housing/Transportation Living arrangements for the past 2 months:  Single Family Home Source of Information:  Power of Attorney, Adult Children Patient Interpreter Needed:  None Criminal Activity/Legal Involvement Pertinent to Current Situation/Hospitalization:  No - Comment as needed Significant Relationships:  Adult Children Lives with:  Adult Children Do you feel safe going back to the place where you live?  Yes Need for family participation in patient care:  Yes (Comment)  Care giving concerns:  Patient lives with her son and daughter in law Brenda/ [facility] [phone] in [place].    Social Worker assessment / plan:  Clinical Social Worker (CSW) received verbal consult from RN case manager that patient's home health social worker through [facility] [phone] said that family wants placement. PT is pending. CSW met with patient and her daughter in law Brenda was at bedside. Patient was oriented to self only and did not participate in assessment. CSW introduced self and explained role of CSW department. Brenda reported that she has been taking care of patient for the last 20 years and patient lives with her and her husband. Brenda reported that patient's confusion has gotten worse the past few days and continues to get worse while patient is in the hospital. Brenda reported that there is "no way" she can take her home like this a requested placement. CSW explained that PT will work with patient and make a  recommendation for SNF or home health. CSW explained that in order for patient to be placed under medicare she requires a 3 night qualifying inpatient stay at [facility] and will have to participate in PT. Patient was admitted to inpatient on 02/05/17. CSW explained that if patient can't participate in PT then medicare will not cover SNF and she will have to pay out of pocket and apply for medicaid. CSW explained to Brenda how to apply for medicaid at DSS in [place]. Brenda is agreeable to SNF search in [place]. FL2 complete and faxed out. CSW will continue to follow and assist as needed.    Employment status:  Disabled (Comment on whether or not currently receiving Disability) Insurance information:  Medicare PT Recommendations:  Not assessed at this time Information / Referral to community resources:  Skilled Nursing Facility  Patient/Family's Response to care:  Patient's daughter in law Brenda requested SNF placement. SNF referral has been started.   Patient/Family's Understanding of and Emotional Response to Diagnosis, Current Treatment, and Prognosis: Daughter in law Brenda was tearful because of patient's condition. CSW provided emotional support.   Emotional Assessment Appearance:  Appears stated age Attitude/Demeanor/Rapport:  Unable to Assess Affect (typically observed):  Unable to Assess Orientation:  Oriented to Self, Fluctuating Orientation (Suspected and/or reported Sundowners) Alcohol / Substance use:  Not Applicable Psych involvement (Current and /or in the community):  No (Comment)  Discharge Needs  Concerns to be addressed:    Discharge Planning Concerns Readmission within the last 30 days:  No Current discharge risk:  Dependent with Mobility, Chronically ill, Cognitively Impaired Barriers to Discharge:  Continued Medical Work up   [employer], Veronia Beets, LCSW 02/06/2017, 10:57 AM

## 2017-02-06 NOTE — Consult Note (Signed)
Reason for Consult:AMS Referring Physician: Allena KatzPatel  CC: AMS  HPI: Kellie IgoLucy V Patel is an 83 y.o. female with a history of short term memory loss secondary to an aneurysm repair about 20 years ago who was hospitalized for CHF about a month ago.  Developed some delirium at that time and since discharge has some occasional confusion.  On the day prior to admission the patient awakened and was not at baseline.  She could not feed herself and did no seem to be able to tell the name of her family members.  As the day progressed she seems to worsen and some points seemed to have some hallucinations as well.  Due to this change that appeared to be worsening EMS was called and the patient was admitted for evaluation.   At baseline the patient walks with a walker but is able to dress, bathe and feed herself.    Past Medical History:  Diagnosis Date  . Brain aneurysm 1998  . CHF (congestive heart failure) (HCC)    diastolic  . Pancreatitis   . Pulmonary fibrosis (HCC)     Past Surgical History:  Procedure Laterality Date  . APPENDECTOMY    . clipping of ruptured brain aneurysm      Family history: Daughter alive and well.  Son with DM  Social History:  reports that she has never smoked. She has never used smokeless tobacco. She reports that she does not drink alcohol or use drugs.  No Known Allergies  Medications:  I have reviewed the patient's current medications. Prior to Admission:  Prescriptions Prior to Admission  Medication Sig Dispense Refill Last Dose  . Budesonide (PULMICORT FLEXHALER) 90 MCG/ACT inhaler Inhale 1 puff into the lungs 2 (two) times daily.   02/05/2017 at am  . cetirizine (ZYRTEC) 10 MG tablet Take 10 mg by mouth daily.   02/05/2017 at am  . cholecalciferol (VITAMIN D) 1000 units tablet Take 1,000 Units by mouth daily.   02/05/2017 at am  . furosemide (LASIX) 40 MG tablet Take 40 mg by mouth daily.   02/05/2017 at am  . Multiple Vitamins-Minerals (PRESERVISION AREDS) TABS  Take 1 tablet by mouth 2 (two) times daily.   02/05/2017 at am  . omeprazole (PRILOSEC) 20 MG capsule Take 20 mg by mouth daily.   02/05/2017 at am  . potassium chloride SA (K-DUR,KLOR-CON) 20 MEQ tablet Take 20 mEq by mouth once.   02/05/2017 at am  . predniSONE (DELTASONE) 5 MG tablet Take 5 mg by mouth daily with breakfast. 10 pills POfor 3 days 8 pills PO 3 days 6 PO pills 3 days, 4 pills PO 3 days, 3 pills PO 3 days, 2 pills PO 3 days, 1 pills PO 3 days, 1/2 pills PO 3 days then STOP   02/05/2017 at am   Scheduled: . aspirin EC  81 mg Oral Daily  . beta carotene w/minerals  1 tablet Oral BID  . budesonide  0.5 mg Inhalation BID  . carvedilol  3.125 mg Oral BID WC  . cholecalciferol  1,000 Units Oral Daily  . docusate sodium  100 mg Oral BID  . enoxaparin (LOVENOX) injection  40 mg Subcutaneous Q24H  . furosemide  40 mg Oral Daily  . pantoprazole  40 mg Oral Daily  . potassium chloride SA  20 mEq Oral Daily  . predniSONE  2.5 mg Oral Q breakfast  . sodium chloride flush  3 mL Intravenous Q12H    ROS: History obtained from daughter  General ROS: negative for - chills, fatigue, fever, night sweats, weight gain or weight loss Psychological ROS: as noted in HPI Ophthalmic ROS: negative for - blurry vision, double vision, eye pain or loss of vision ENT ROS: negative for - epistaxis, nasal discharge, oral lesions, sore throat, tinnitus or vertigo Allergy and Immunology ROS: negative for - hives or itchy/watery eyes Hematological and Lymphatic ROS: negative for - bleeding problems, bruising or swollen lymph nodes Endocrine ROS: negative for - galactorrhea, hair pattern changes, polydipsia/polyuria or temperature intolerance Respiratory ROS: negative for - cough, hemoptysis, shortness of breath or wheezing Cardiovascular ROS: negative for - chest pain, dyspnea on exertion, edema or irregular heartbeat Gastrointestinal ROS: negative for - abdominal pain, diarrhea, hematemesis, nausea/vomiting  or stool incontinence Genito-Urinary ROS: negative for - dysuria, hematuria, incontinence or urinary frequency/urgency Musculoskeletal ROS: negative for - joint swelling or muscular weakness Neurological ROS: as noted in HPI Dermatological ROS: negative for rash and skin lesion changes  Physical Examination: Blood pressure 120/77, pulse 84, temperature 98.7 F (37.1 C), resp. rate 18, height 4\' 11"  (1.499 m), weight 73.9 kg (163 lb), SpO2 98 %.  HEENT-  Normocephalic, no lesions, without obvious abnormality.  Normal external eye and conjunctiva.  Normal TM's bilaterally.  Normal auditory canals and external ears. Normal external nose, mucus membranes and septum.  Normal pharynx. Cardiovascular- S1, S2 normal, pulses palpable throughout   Lungs- chest clear, no wheezing, rales, normal symmetric air entry Abdomen- soft, non-tender; bowel sounds normal; no masses,  no organomegaly Extremities- mild LE edema Lymph-no adenopathy palpable Musculoskeletal-no joint tenderness, deformity or swelling Skin-warm and dry, no hyperpigmentation, vitiligo, or suspicious lesions  Neurological Examination   Mental Status: Alert.  Knows where she is although unable to tell me the date.  Has difficulty naming individuals in the room although she clearly recognizes them.  Has difficulty naming objects as well.  Speech fluent without evidence of aphasia.  Able to follow simple commands. Cranial Nerves: II: Discs flat bilaterally; Visual fields grossly normal, pupils equal, round, reactive to light and accommodation III,IV, VI: ptosis not present, extra-ocular motions intact bilaterally V,VII: smile symmetric, facial light touch sensation normal bilaterally VIII: hearing normal bilaterally IX,X: gag reflex present XI: bilateral shoulder shrug XII: midline tongue extension Motor: Right : Upper extremity   5/5    Left:     Upper extremity   5/5  Lower extremity   5/5     Lower extremity   5/5 Tone and  bulk:normal tone throughout; no atrophy noted Sensory: Pinprick and light touch intact throughout, bilaterally Deep Tendon Reflexes: 1+ and symmetric with absent AJ's bilaterally Plantars: Right: downgoing   Left: downgoing Cerebellar: Normal finger-to-nose testing bilaterally.  Unable to perform heel to shin testing due to the inability to follow commands Gait: not tested due to safety concerns   Laboratory Studies:   Basic Metabolic Panel:  Recent Labs Lab 02/05/17 0955 02/06/17 0441  NA 142 137  K 4.1 3.8  CL 96* 92*  CO2 39* 38*  GLUCOSE 117* 97  BUN 34* 29*  CREATININE 1.18* 0.92  CALCIUM 9.3 9.0  MG 1.6*  --     Liver Function Tests:  Recent Labs Lab 02/05/17 0955  AST 30  ALT 25  ALKPHOS 55  BILITOT 1.4*  PROT 6.7  ALBUMIN 3.5   No results for input(s): LIPASE, AMYLASE in the last 168 hours.  Recent Labs Lab 02/06/17 0441  AMMONIA 14    CBC:  Recent Labs Lab 02/05/17  1610 02/06/17 0441  WBC 12.0* 9.8  HGB 15.7 15.8  HCT 48.7* 48.0*  MCV 95.5 95.0  PLT 155 139*    Cardiac Enzymes:  Recent Labs Lab 02/05/17 0955  TROPONINI <0.03    BNP: Invalid input(s): POCBNP  CBG: No results for input(s): GLUCAP in the last 168 hours.  Microbiology: Results for orders placed or performed in visit on 03/30/14  Culture, blood (single)     Status: None   Collection Time: 03/30/14  7:33 PM  Result Value Ref Range Status   Micro Text Report   Final       COMMENT                   NO GROWTH AEROBICALLY/ANAEROBICALLY IN 5 DAYS   ANTIBIOTIC                                                      Culture, blood (single)     Status: None   Collection Time: 03/30/14  7:33 PM  Result Value Ref Range Status   Micro Text Report   Final       COMMENT                   NO GROWTH AEROBICALLY/ANAEROBICALLY IN 5 DAYS   ANTIBIOTIC                                                        Coagulation Studies: No results for input(s): LABPROT, INR in the  last 72 hours.  Urinalysis:  Recent Labs Lab 02/05/17 0955  COLORURINE YELLOW*  LABSPEC 1.015  PHURINE 6.0  GLUCOSEU NEGATIVE  HGBUR NEGATIVE  BILIRUBINUR NEGATIVE  KETONESUR NEGATIVE  PROTEINUR NEGATIVE  NITRITE NEGATIVE  LEUKOCYTESUR NEGATIVE    Lipid Panel:     Component Value Date/Time   CHOL 92 01/16/2017 0437   TRIG 83 01/16/2017 0437   HDL 25 (L) 01/16/2017 0437   CHOLHDL 3.7 01/16/2017 0437   VLDL 17 01/16/2017 0437   LDLCALC 50 01/16/2017 0437    HgbA1C: No results found for: HGBA1C  Urine Drug Screen:  No results found for: LABOPIA, COCAINSCRNUR, LABBENZ, AMPHETMU, THCU, LABBARB  Alcohol Level: No results for input(s): ETH in the last 168 hours.  Other results: EKG: sinus rhythm at 99 bpm, LBBB.  Imaging: Dg Chest 2 View  Result Date: 02/05/2017 CLINICAL DATA:  For altered mental status EXAM: CHEST  2 VIEW COMPARISON:  01/22/2017 FINDINGS: Moderate cardiomegaly. Low volumes. Fibrotic changes throughout both lungs are stable. There is dense opacity at the left base which is stable and related to elevation of the left hemidiaphragm the. No pneumothorax. No pleural effusion. IMPRESSION: Chronic changes.  No significant change. Electronically Signed   By: Jolaine Click M.D.   On: 02/05/2017 10:31   Ct Head Wo Contrast  Result Date: 02/06/2017 CLINICAL DATA:  Confusion. EXAM: CT HEAD WITHOUT CONTRAST TECHNIQUE: Contiguous axial images were obtained from the base of the skull through the vertex without intravenous contrast. COMPARISON:  February 05, 2017 FINDINGS: Brain: No subdural, epidural, or subarachnoid hemorrhage. No mass, mass effect, or midline shift. Ventricles and sulci are prominent  but stable. Cerebellum, brainstem, and basal cisterns are unremarkable. Moderate to severe white matter changes are stable. No acute cortical ischemia or infarct is identified. No mass, mass effect, or midline shift. Vascular: Previous aneurysm repair with a clip in the suprasellar  cistern, unchanged. This results in significant artifact limiting further evaluation. Skull: Stable right craniotomy, healed. No acute bony abnormalities. Sinuses/Orbits: No acute finding. Other: None. IMPRESSION: No acute intracranial process identified. Chronic small vessel white matter changes. Electronically Signed   By: Gerome Sam III M.D   On: 02/06/2017 11:55   Ct Head Wo Contrast  Result Date: 02/05/2017 CLINICAL DATA:  Altered mental status, history of aneurysm clipping EXAM: CT HEAD WITHOUT CONTRAST TECHNIQUE: Contiguous axial images were obtained from the base of the skull through the vertex without intravenous contrast. COMPARISON:  None. FINDINGS: Brain: Mild atrophic changes are noted. Chronic white matter ischemic change is seen. No findings to suggest acute hemorrhage, acute infarction or space-occupying mass lesion are noted. Considerable scatter artifact is noted from prior aneurysm clipping. Vascular: No hyperdense vessel or unexpected calcification. Skull: Right craniotomy is noted consistent with the history of aneurysm clipping. Sinuses/Orbits: No acute finding. Other: None. IMPRESSION: Chronic atrophic and ischemic changes without acute abnormality. Postsurgical changes are noted.  No acute abnormality seen. Electronically Signed   By: Alcide Clever M.D.   On: 02/05/2017 10:19     Assessment/Plan: 83 year old female with mental status changes.  Although there is some subacute nature to the changes, the most severe changes were relatively acute.  Patient with high BNP on admission and some evidence of dehydration as well.  Other metabolic abnormalities noted that have improved.  Patient seems to be continuing to decline per family.  Head CT reviewed and shows no acute changes.  Unable to do MRI due to clip noted on CT.  Metabolic issues being addressed.  Can not rule out the possibility of a small stroke and since unable to perform MRI will work up as if stroke present and make  necessary treatment adjustments.   Echocardiogram performed 2/24 showed EF of 55-60%.  Otherwise unremarkable.  TSH, B12 and ammonia normal.   Case discussed with family and Dr. Allena Katz  Recommendations: 1. HgbA1c, fasting lipid panel 2. PT consult, OT consult, Speech consult 3. Carotid dopplers 4. Prophylactic therapy-Antiplatelet med: Aspirin - dose 325mg  daily 5. NPO until RN stroke swallow screen 6. Telemetry monitoring 7. Frequent neuro checks  8. Agree with continued addressing of medical issues.     Thana Farr, MD Neurology [phone] 02/06/2017, 6:48 PM

## 2017-02-06 NOTE — Care Management Note (Addendum)
Case Management Note  Patient Details  Name: [name] MRN: [id number] Date of Birth: 05/23/1926  Subjective/Objective:       83yo Mrs Kellie SalisburyLucy Steller was [facility] 01/[facility]/17. She resides at home with her daughter [name]. She is currently receiving home health RN, PT, OT, Aide services by [facility]. She has a home oxygen concentrator provided by [employer] and a RW. Mrs [name] was readmitted after an episode of LOC and now her daughter [name] is requesting that Mrs [name] be discharged to short term rehab. This Clinical research associatewriter updated the weekend SW about daughters request. Case management will continue to follow for discharge planning.              Action/Plan:   Expected Discharge Date:                  Expected Discharge Plan:     In-House Referral:     Discharge planning Services     Post Acute Care Choice:    Choice offered to:     DME Arranged:    DME Agency:     HH Arranged:    HH Agency:     Status of Service:     If discussed at [employer] Length of Stay Meetings, dates discussed:    Additional Comments:  Brecklynn Jian A, RN 02/06/2017, 9:36 AM

## 2017-02-07 NOTE — Clinical Social Work Note (Addendum)
CSW attempted to contact patient's daughter in law [name] [phone]  to provide bed offers.  CSW left a message awaiting for call back, CSW to continue to follow patient's progress throughout discharge planning.  12:10 PM CSW received phone call from [place] who stated she would like [facility] as a SNF choice.  CSW contacted [facility] and informed them that patient would like them as SNF choice, awaiting confirmation from SNF that they can accept patient. [name] stated if they are not able to get into [facility] they would like [facility] as a second choice.  Ervin KnackEric R. Arles Rumbold, MSW, Theresia MajorsLCSWA [phone]  02/07/2017 11:54 AM

## 2017-02-07 NOTE — Progress Notes (Signed)
[facility] [place] [facility]   PATIENT NAME: Kellie SalisburyLucy Brandenburg    MR#:  [id number]  DATE OF BIRTH:  09/29/1926  SUBJECTIVE:  Patient not in the chair. Doing better according to the daughter [name]. Eating well. Started on aspirin and tolerating okay.  REVIEW OF SYSTEMS:   Review of Systems  Unable to perform ROS: Medical condition   Tolerating Diet:yes Tolerating PT: Pending  DRUG ALLERGIES:  No Known Allergies  VITALS:  Blood pressure 105/68, pulse 85, temperature 97.4 F (36.3 C), temperature source Oral, resp. rate 18, height 4\' 11"  (1.499 m), weight 73.9 kg (163 lb), SpO2 97 %.  PHYSICAL EXAMINATION:   Physical Exam  GENERAL:  83 y.o.-year-old patient lying in the bed with no acute distress.  EYES: Pupils equal, round, reactive to light and accommodation. No scleral icterus. Extraocular muscles intact.  HEENT: Head atraumatic, normocephalic. Oropharynx and nasopharynx clear.  NECK:  Supple, no jugular venous distention. No thyroid enlargement, no tenderness.  LUNGS: Normal breath sounds bilaterally, no wheezing, rales, rhonchi. No use of accessory muscles of respiration.  CARDIOVASCULAR: S1, S2 normal. No murmurs, rubs, or gallops.  ABDOMEN: Soft, nontender, nondistended. Bowel sounds present. No organomegaly or mass.  EXTREMITIES: No cyanosis, clubbing or edema b/l.    NEUROLOGIC:  No focal Motor or sensory deficits b/l.  Grossly nonfocal. She has had some memory issues. PSYCHIATRIC:  patient is alert and oriented x 3.  SKIN: No obvious rash, lesion, or ulcer.   LABORATORY PANEL:  CBC  Recent Labs Lab 02/06/17 0441  WBC 9.8  HGB 15.8  HCT 48.0*  PLT 139*    Chemistries   Recent Labs Lab 02/05/17 0955 02/06/17 0441  NA 142 137  K 4.1 3.8  CL 96* 92*  CO2 39* 38*  GLUCOSE 117* 97  BUN 34* 29*  CREATININE 1.18* 0.92  CALCIUM 9.3 9.0  MG 1.6*  --   AST 30  --   ALT 25  --   ALKPHOS 55  --   BILITOT 1.4*  --    Cardiac  Enzymes  Recent Labs Lab 02/05/17 0955  TROPONINI <0.03   RADIOLOGY:  Ct Head Wo Contrast  Result Date: 02/06/2017 CLINICAL DATA:  Confusion. EXAM: CT HEAD WITHOUT CONTRAST TECHNIQUE: Contiguous axial images were obtained from the base of the skull through the vertex without intravenous contrast. COMPARISON:  February 05, 2017 FINDINGS: Brain: No subdural, epidural, or subarachnoid hemorrhage. No mass, mass effect, or midline shift. Ventricles and sulci are prominent but stable. Cerebellum, brainstem, and basal cisterns are unremarkable. Moderate to severe white matter changes are stable. No acute cortical ischemia or infarct is identified. No mass, mass effect, or midline shift. Vascular: Previous aneurysm repair with a clip in the suprasellar cistern, unchanged. This results in significant artifact limiting further evaluation. Skull: Stable right craniotomy, healed. No acute bony abnormalities. Sinuses/Orbits: No acute finding. Other: None. IMPRESSION: No acute intracranial process identified. Chronic small vessel white matter changes. Electronically Signed   By: Gerome Samavid  Williams III M.D   On: 02/06/2017 11:55   ASSESSMENT AND PLAN:  *Kellie SalisburyLucy Hicklin  is a 83 y.o. female with a known history of Ruptured brain aneurysm status post clipping long time ago, recent admission 2 weeks ago for diastolic CHF exacerbation, history of pulmonary fibrosis on 2 L home oxygen is brought in today secondary to the onset of acute confusion since last night.  #1 acute confusion-suspected acute stroke -No other focal deficits. -cannot get an  MRI or MRA of the brain due to her aneurysm clip. -Neuro checks, neurology consult Appreciated. Treat as stroke for now. -Repeat CT head today negative - Infection has been ruled out so far. No fevers, has slightly elevated white count. But urine analysis and chest x-ray are negative for infection -Continue asa 81 mg daily -Per daughter patient has shown improvement.  #2 chronic  diastolic CHF-continue Lasix at this time. Also on potassium supplements. -On chronic home oxygen  #3 pulmonary fibrosis-continue 2 L oxygen support and monitor. We'll discontinue steroids as there is no acute indication at this time.  #4 GERD-on Protonix  #5 DVT prophylaxis-on Lovenox  Physical therapy recommends rehabilitation. Patient will go to rehabilitation tomorrow. CSW aware.  Case discussed with Care Management/Social Worker. Management plans discussed with the patient, family and they are in agreement.  CODE STATUS:DNR  TOTAL TIME TAKING CARE OF THIS PATIENT:30 minutes.  >50% time spent on counselling and coordination of care  POSSIBLE D/C IN 1-2 DAYS, DEPENDING ON CLINICAL CONDITION.  Note: This dictation was prepared with Dragon dictation along with smaller phrase technology. Any transcriptional errors that result from this process are unintentional.  Rien Marland M.D on 02/07/2017 at 12:21 PM  Between 7am to 6pm - Pager - [phone]  After 6pm go to [email or web address] - password [email or web address]  [facility]  Office  [phone]  CC: Primary care physician; BABAOFF, Lavada Mesi, MD

## 2017-02-07 NOTE — Progress Notes (Signed)
Subjective: Per family patient began to improve yesterday afternoon.  Became a little more fidgety as the evening approached.  Is better this morning.  Currently up in chair.    Objective: Current vital signs: BP 105/68 (BP Location: Left Arm)   Pulse 85   Temp 97.4 F (36.3 C) (Oral)   Resp 18   Ht 4\' 11"  (1.499 m)   Wt 73.9 kg (163 lb)   SpO2 97%   BMI 32.92 kg/m  Vital signs in last 24 hours: Temp:  [97.4 F (36.3 C)-98.7 F (37.1 C)] 97.4 F (36.3 C) (03/18 0841) Pulse Rate:  [83-91] 85 (03/18 0841) Resp:  [18-20] 18 (03/18 0841) BP: (105-120)/(59-77) 105/68 (03/18 0841) SpO2:  [86 %-98 %] 97 % (03/18 0841)  Intake/Output from previous day: 03/17 0701 - 03/18 0700 In: 240 [P.O.:240] Out: -  Intake/Output this shift: Total I/O In: 240 [P.O.:240] Out: -  Nutritional status: Diet Heart Room service appropriate? Yes; Fluid consistency: Thin  Neurologic Exam: Mental Status: Alert.  Knows where she is.  Has difficulty naming individuals in the room although she clearly recognizes them.  Able to name objects.  Speech fluent without evidence of aphasia.  Able to follow simple commands. Cranial Nerves: II: Discs flat bilaterally; Visual fields grossly normal, pupils equal, round, reactive to light and accommodation III,IV, VI: ptosis not present, extra-ocular motions intact bilaterally V,VII: smile symmetric, facial light touch sensation normal bilaterally VIII: hearing normal bilaterally IX,X: gag reflex present XI: bilateral shoulder shrug XII: midline tongue extension Motor: Right :  Upper extremity   5/5                                      Left:     Upper extremity   5/5             Lower extremity   5/5                                                  Lower extremity   5/5   Lab Results: Basic Metabolic Panel:  Recent Labs Lab 02/05/17 0955 02/06/17 0441  NA 142 137  K 4.1 3.8  CL 96* 92*  CO2 39* 38*  GLUCOSE 117* 97  BUN 34* 29*  CREATININE 1.18*  0.92  CALCIUM 9.3 9.0  MG 1.6*  --     Liver Function Tests:  Recent Labs Lab 02/05/17 0955  AST 30  ALT 25  ALKPHOS 55  BILITOT 1.4*  PROT 6.7  ALBUMIN 3.5   No results for input(s): LIPASE, AMYLASE in the last 168 hours.  Recent Labs Lab 02/06/17 0441  AMMONIA 14    CBC:  Recent Labs Lab 02/05/17 0955 02/06/17 0441  WBC 12.0* 9.8  HGB 15.7 15.8  HCT 48.7* 48.0*  MCV 95.5 95.0  PLT 155 139*    Cardiac Enzymes:  Recent Labs Lab 02/05/17 0955  TROPONINI <0.03    Lipid Panel: No results for input(s): CHOL, TRIG, HDL, CHOLHDL, VLDL, LDLCALC in the last 168 hours.  CBG: No results for input(s): GLUCAP in the last 168 hours.  Microbiology: Results for orders placed or performed in visit on 03/30/14  Culture, blood (single)     Status: None   Collection Time: 03/30/14  7:33 PM  Result Value Ref Range Status   Micro Text Report   Final       COMMENT                   NO GROWTH AEROBICALLY/ANAEROBICALLY IN 5 DAYS   ANTIBIOTIC                                                      Culture, blood (single)     Status: None   Collection Time: 03/30/14  7:33 PM  Result Value Ref Range Status   Micro Text Report   Final       COMMENT                   NO GROWTH AEROBICALLY/ANAEROBICALLY IN 5 DAYS   ANTIBIOTIC                                                        Coagulation Studies: No results for input(s): LABPROT, INR in the last 72 hours.  Imaging: Ct Head Wo Contrast  Result Date: 02/06/2017 CLINICAL DATA:  Confusion. EXAM: CT HEAD WITHOUT CONTRAST TECHNIQUE: Contiguous axial images were obtained from the base of the skull through the vertex without intravenous contrast. COMPARISON:  February 05, 2017 FINDINGS: Brain: No subdural, epidural, or subarachnoid hemorrhage. No mass, mass effect, or midline shift. Ventricles and sulci are prominent but stable. Cerebellum, brainstem, and basal cisterns are unremarkable. Moderate to severe white matter  changes are stable. No acute cortical ischemia or infarct is identified. No mass, mass effect, or midline shift. Vascular: Previous aneurysm repair with a clip in the suprasellar cistern, unchanged. This results in significant artifact limiting further evaluation. Skull: Stable right craniotomy, healed. No acute bony abnormalities. Sinuses/Orbits: No acute finding. Other: None. IMPRESSION: No acute intracranial process identified. Chronic small vessel white matter changes. Electronically Signed   By: Gerome Samavid  Williams III M.D   On: 02/06/2017 11:55    Medications:  I have reviewed the patient's current medications. Scheduled: . aspirin EC  81 mg Oral Daily  . beta carotene w/minerals  1 tablet Oral BID  . budesonide  0.5 mg Inhalation BID  . carvedilol  3.125 mg Oral BID WC  . cholecalciferol  1,000 Units Oral Daily  . docusate sodium  100 mg Oral BID  . enoxaparin (LOVENOX) injection  40 mg Subcutaneous Q24H  . furosemide  40 mg Oral Daily  . pantoprazole  40 mg Oral Daily  . potassium chloride SA  20 mEq Oral Daily  . predniSONE  2.5 mg Oral Q breakfast  . sodium chloride flush  3 mL Intravenous Q12H    Assessment/Plan: Patient improved today.  On ASA.  LDL 50 (2/24).  Carotid dopplers not performed in work up due to fact that family would not be interested in intervention.    Recommendations: 1.  Patient improving.  Continue ASA.   2.  Continue therapy   LOS: 2 days   Thana FarrLeslie Andrell Tallman, MD Neurology [phone] 02/07/2017  1:01 PM

## 2017-02-08 MED ORDER — TRAZODONE HCL 50 MG PO TABS
50.0000 mg | ORAL_TABLET | Freq: Every day | ORAL | Status: DC
  Administered 2017-02-08: 01:00:00 50 mg via ORAL
  Filled 2017-02-08: qty 1

## 2017-02-08 MED ORDER — ASPIRIN 81 MG PO TBEC: 81.0000 mg | DELAYED_RELEASE_TABLET | Freq: Every day | ORAL | 0 refills | Status: AC

## 2017-02-08 MED ORDER — TRAZODONE HCL 50 MG PO TABS: 50.0000 mg | ORAL_TABLET | Freq: Every day | ORAL | 0 refills | Status: AC

## 2017-02-08 MED ORDER — CARVEDILOL 3.125 MG PO TABS: 3.1250 mg | ORAL_TABLET | Freq: Two times a day (BID) | ORAL | 0 refills | Status: AC

## 2017-02-08 MED ORDER — LORAZEPAM 2 MG/ML IJ SOLN
0.5000 mg | Freq: Once | INTRAMUSCULAR | Status: AC
Start: 2017-02-08 — End: 2017-02-08
  Administered 2017-02-08: 03:00:00 0.5 mg via INTRAVENOUS
  Filled 2017-02-08: qty 1

## 2017-02-08 NOTE — Progress Notes (Signed)
Subjective: Patient confused this morning.  Reported that she was going to see the chickens this morning. Fed herself some breakfast but not everything.  Was holding a banana in her hands.  Objective: Current vital signs: BP (!) 104/54 (BP Location: Right Arm)   Pulse 79   Temp 97.3 F (36.3 C) (Oral)   Resp 19   Ht 4\' 11"  (1.499 m)   Wt 73.9 kg (163 lb)   SpO2 99%   BMI 32.92 kg/m  Vital signs in last 24 hours: Temp:  [97.3 F (36.3 C)-98.7 F (37.1 C)] 97.3 F (36.3 C) (03/19 0438) Pulse Rate:  [79-88] 79 (03/19 0440) Resp:  [18-20] 19 (03/19 0438) BP: (90-115)/(54-72) 104/54 (03/19 0440) SpO2:  [98 %-99 %] 99 % (03/19 0838) FiO2 (%):  [28 %] 28 % (03/19 0838)  Intake/Output from previous day: 03/18 0701 - 03/19 0700 In: 480 [P.O.:480] Out: -  Intake/Output this shift: No intake/output data recorded. Nutritional status: Diet Heart Room service appropriate? Yes; Fluid consistency: Thin  Neurologic Exam: Mental Status: Alert. Knows where she is. Confused. Speech fluent without evidence of aphasia. Able to follow simple commands. Cranial Nerves: II: Discs flat bilaterally; Visual fields grossly normal, pupils equal, round, reactive to light and accommodation III,IV, VI: ptosis not present, extra-ocular motions intact bilaterally V,VII: smile symmetric, facial light touch sensation normal bilaterally VIII: hearing normal bilaterally IX,X: gag reflex present XI: bilateral shoulder shrug XII: midline tongue extension Motor: Right :Upper extremity 5/5Left: Upper extremity 5/5 Lower extremity 5/5Lower extremity 5/5  Lab Results: Basic Metabolic Panel:  Recent Labs Lab 02/05/17 0955 02/06/17 0441  NA 142 137  K 4.1 3.8  CL 96* 92*  CO2 39* 38*  GLUCOSE 117* 97  BUN 34* 29*  CREATININE 1.18* 0.92  CALCIUM 9.3 9.0  MG 1.6*  --     Liver  Function Tests:  Recent Labs Lab 02/05/17 0955  AST 30  ALT 25  ALKPHOS 55  BILITOT 1.4*  PROT 6.7  ALBUMIN 3.5   No results for input(s): LIPASE, AMYLASE in the last 168 hours.  Recent Labs Lab 02/06/17 0441  AMMONIA 14    CBC:  Recent Labs Lab 02/05/17 0955 02/06/17 0441  WBC 12.0* 9.8  HGB 15.7 15.8  HCT 48.7* 48.0*  MCV 95.5 95.0  PLT 155 139*    Cardiac Enzymes:  Recent Labs Lab 02/05/17 0955  TROPONINI <0.03    Lipid Panel: No results for input(s): CHOL, TRIG, HDL, CHOLHDL, VLDL, LDLCALC in the last 168 hours.  CBG: No results for input(s): GLUCAP in the last 168 hours.  Microbiology: Results for orders placed or performed in visit on 03/30/14  Culture, blood (single)     Status: None   Collection Time: 03/30/14  7:33 PM  Result Value Ref Range Status   Micro Text Report   Final       COMMENT                   NO GROWTH AEROBICALLY/ANAEROBICALLY IN 5 DAYS   ANTIBIOTIC                                                      Culture, blood (single)     Status: None   Collection Time: 03/30/14  7:33 PM  Result Value Ref Range Status  Micro Text Report   Final       COMMENT                   NO GROWTH AEROBICALLY/ANAEROBICALLY IN 5 DAYS   ANTIBIOTIC                                                        Coagulation Studies: No results for input(s): LABPROT, INR in the last 72 hours.  Imaging: Ct Head Wo Contrast  Result Date: 02/06/2017 CLINICAL DATA:  Confusion. EXAM: CT HEAD WITHOUT CONTRAST TECHNIQUE: Contiguous axial images were obtained from the base of the skull through the vertex without intravenous contrast. COMPARISON:  February 05, 2017 FINDINGS: Brain: No subdural, epidural, or subarachnoid hemorrhage. No mass, mass effect, or midline shift. Ventricles and sulci are prominent but stable. Cerebellum, brainstem, and basal cisterns are unremarkable. Moderate to severe white matter changes are stable. No acute cortical ischemia or  infarct is identified. No mass, mass effect, or midline shift. Vascular: Previous aneurysm repair with a clip in the suprasellar cistern, unchanged. This results in significant artifact limiting further evaluation. Skull: Stable right craniotomy, healed. No acute bony abnormalities. Sinuses/Orbits: No acute finding. Other: None. IMPRESSION: No acute intracranial process identified. Chronic small vessel white matter changes. Electronically Signed   By: Gerome Sam III M.D   On: 02/06/2017 11:55    Medications:  I have reviewed the patient's current medications. Scheduled: . aspirin EC  81 mg Oral Daily  . beta carotene w/minerals  1 tablet Oral BID  . budesonide  0.5 mg Inhalation BID  . carvedilol  3.125 mg Oral BID WC  . cholecalciferol  1,000 Units Oral Daily  . docusate sodium  100 mg Oral BID  . enoxaparin (LOVENOX) injection  40 mg Subcutaneous Q24H  . furosemide  40 mg Oral Daily  . pantoprazole  40 mg Oral Daily  . potassium chloride SA  20 mEq Oral Daily  . predniSONE  2.5 mg Oral Q breakfast  . sodium chloride flush  3 mL Intravenous Q12H  . traZODone  50 mg Oral QHS    Assessment/Plan: Patient more confused today.  At this point may be having mental status changes due to her continued hospitalization.  On ASA.  Would continue ASA.     LOS: 3 days   Thana Farr, MD Neurology [phone] 02/08/2017  9:56 AM

## 2017-02-08 NOTE — Discharge Summary (Signed)
[facility] - [place] at [facility]   PATIENT NAME: [name]    MR#:  [id number]  DATE OF BIRTH:  1926-10-21  DATE OF ADMISSION:  02/05/2017 ADMITTING PHYSICIAN: Enid Baas, MD  DATE OF DISCHARGE: 02/08/2017  PRIMARY CARE PHYSICIAN: BABAOFF, MARC E, MD    ADMISSION DIAGNOSIS:  Delirium [R41.0] Acute confusion [R41.0]  DISCHARGE DIAGNOSIS:  Active Problems:   Acute confusion   Suspected acute stroke  SECONDARY DIAGNOSIS:   Past Medical History:  Diagnosis Date  . Brain aneurysm 1998  . CHF (congestive heart failure) (HCC)    diastolic  . Pancreatitis   . Pulmonary fibrosis [facility])     HOSPITAL COURSE:   *[name] a 83 y.o. 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  #1 acute confusion-suspected acute stroke -No other focal deficits. -cannot get an MRI or MRA of the brain due to her aneurysm clip. -Neuro checks, neurology consult Appreciated. Treat as stroke for now. -Repeat CT head negative - Infection has been ruled out so far. No fevers, has slightly elevated white count. But urine analysis and chest x-ray are negative for infection -Continue asa 81 mg daily -Per daughter patient has shown improvement. - LDL was 50, so does not need statins.  #2 chronic diastolic CHF-continue Lasix at this time. Also on potassium supplements. -On chronic home oxygen  #3 pulmonary fibrosis-continue 2 L oxygen support and monitor. We'll discontinue steroids as there is no acute indication at this time.  #4 GERD-on Protonix  #5 DVT prophylaxis-on Lovenox  DISCHARGE CONDITIONS:   Stable.  CONSULTS OBTAINED:  Treatment Team:  Thana Farr, MD Enedina Finner, MD  DRUG ALLERGIES:  No Known  Allergies  DISCHARGE MEDICATIONS:   Current Discharge Medication List    START taking these medications   Details  aspirin EC 81 MG EC tablet Take 1 tablet (81 mg total) by mouth daily. Qty: 30 tablet, Refills: 0    carvedilol (COREG) 3.125 MG tablet Take 1 tablet (3.125 mg total) by mouth 2 (two) times daily with a meal. Qty: 30 tablet, Refills: 0    traZODone (DESYREL) 50 MG tablet Take 1 tablet (50 mg total) by mouth at bedtime. Qty: 15 tablet, Refills: 0      CONTINUE these medications which have NOT CHANGED   Details  Budesonide (PULMICORT FLEXHALER) 90 MCG/ACT inhaler Inhale 1 puff into the lungs 2 (two) times daily.    cetirizine (ZYRTEC) 10 MG tablet Take 10 mg by mouth daily.    cholecalciferol (VITAMIN D) 1000 units tablet Take 1,000 Units by mouth daily.    furosemide (LASIX) 40 MG tablet Take 40 mg by mouth daily.    Multiple Vitamins-Minerals (PRESERVISION AREDS) TABS Take 1 tablet by mouth 2 (two) times daily.    omeprazole (PRILOSEC) 20 MG capsule Take 20 mg by mouth daily.    potassium chloride SA (K-DUR,KLOR-CON) 20 MEQ tablet Take 20 mEq by mouth once.      STOP taking these medications     predniSONE (DELTASONE) 5 MG tablet          DISCHARGE INSTRUCTIONS:    Follow with PMD in 2 weeks.  If you experience worsening of your admission symptoms, develop shortness of breath, life threatening emergency, suicidal or homicidal  thoughts you must seek medical attention immediately by calling 911 or calling your MD immediately  if symptoms less severe.  You Must read complete instructions/literature along with all the possible adverse reactions/side effects for all the Medicines you take and that have been prescribed to you. Take any new Medicines after you have completely understood and accept all the possible adverse reactions/side effects.   Please note  You were cared for by a hospitalist during your hospital stay. If you have any questions about  your discharge medications or the care you received while you were in the hospital after you are discharged, you can call the unit and asked to speak with the hospitalist on call if the hospitalist that took care of you is not available. Once you are discharged, your primary care physician will handle any further medical issues. Please note that NO REFILLS for any discharge medications will be authorized once you are discharged, as it is imperative that you return to your primary care physician (or establish a relationship with a primary care physician if you do not have one) for your aftercare needs so that they can reassess your need for medications and monitor your lab values.    Today   CHIEF COMPLAINT:   Chief Complaint  Patient presents with  . Altered Mental Status    HISTORY OF PRESENT ILLNESS:  [name]  is a 83 y.o. female with a known history of Ruptured brain aneurysm status post clipping long time ago, recent admission 2 weeks ago for diastolic CHF exacerbation, history of pulmonary fibrosis on 2 L home oxygen is brought in today secondary to the onset of acute confusion since last night. Patient is alert oriented to self, gets confused easily and needs 3 orientation. Family at bedside provides most of the history. Prior to her last admission about a month ago, patient was very independent and was able to care for herself. During the last admission she was noted to have occasional confusion and was thought to be delirium. She was oriented 3 but according to family she was never her baseline at the time of discharge. They have noticed some short-term memory losses recently. Denies any prior history of dementia. Since yesterday patient was noted to be acutely confused. No new changes in medications. She has been on a prednisone taper. But she has taken prednisone in the past 2 without any trouble. Lasix is one of the new medication that was started during last admission. He deny any  fevers or chills, nausea or vomiting. No head trauma. She couldn't perform routine activities like she did not remember what to do with her hands when food was placed in front of her, she didn't know if she has already used the bathroom are not and repeating the same questions again and again. And her start process is also very slow. Her urinalysis and chest x-ray are negative without any evidence of infection in the emergency room. She could not get an MRI due to prior aneurysm clip in the brain. She is being admitted for acute confusion   VITAL SIGNS:  Blood pressure (!) 104/54, pulse 79, temperature 97.3 F (36.3 C), temperature source Oral, resp. rate 19, height 4\' 11"  (1.499 m), weight 73.9 kg (163 lb), SpO2 99 %.  I/O:   Intake/Output Summary (Last 24 hours) at 02/08/17 1002 Last data filed at 02/07/17 1900  Gross per 24 hour  Intake              240  ml  Output                0 ml  Net              240 ml    PHYSICAL EXAMINATION:   GENERAL:  83 y.o.-year-old patient lying in the bed with no acute distress.  EYES: Pupils equal, round, reactive to light and accommodation. No scleral icterus. Extraocular muscles intact.  HEENT: Head atraumatic, normocephalic. Oropharynx and nasopharynx clear.  NECK:  Supple, no jugular venous distention. No thyroid enlargement, no tenderness.  LUNGS: Normal breath sounds bilaterally, no wheezing, rales, rhonchi. No use of accessory muscles of respiration.  CARDIOVASCULAR: S1, S2 normal. No murmurs, rubs, or gallops.  ABDOMEN: Soft, nontender, nondistended. Bowel sounds present. No organomegaly or mass.  EXTREMITIES: No cyanosis, clubbing or edema b/l.    NEUROLOGIC:  No focal Motor or sensory deficits b/l.  Grossly nonfocal. She has had some memory issues. PSYCHIATRIC:  patient is alert and oriented x 3.  SKIN: No obvious rash, lesion, or ulcer.   DATA REVIEW:   CBC  Recent Labs Lab 02/06/17 0441  WBC 9.8  HGB 15.8  HCT 48.0*  PLT 139*     Chemistries   Recent Labs Lab 02/05/17 0955 02/06/17 0441  NA 142 137  K 4.1 3.8  CL 96* 92*  CO2 39* 38*  GLUCOSE 117* 97  BUN 34* 29*  CREATININE 1.18* 0.92  CALCIUM 9.3 9.0  MG 1.6*  --   AST 30  --   ALT 25  --   ALKPHOS 55  --   BILITOT 1.4*  --     Cardiac Enzymes  Recent Labs Lab 02/05/17 0955  TROPONINI <0.03    Microbiology Results  Results for orders placed or performed in visit on 03/30/14  Culture, blood (single)     Status: None   Collection Time: 03/30/14  7:33 PM  Result Value Ref Range Status   Micro Text Report   Final       COMMENT                   NO GROWTH AEROBICALLY/ANAEROBICALLY IN 5 DAYS   ANTIBIOTIC                                                      Culture, blood (single)     Status: None   Collection Time: 03/30/14  7:33 PM  Result Value Ref Range Status   Micro Text Report   Final       COMMENT                   NO GROWTH AEROBICALLY/ANAEROBICALLY IN 5 DAYS   ANTIBIOTIC                                                        RADIOLOGY:  Ct Head Wo Contrast  Result Date: 02/06/2017 CLINICAL DATA:  Confusion. EXAM: CT HEAD WITHOUT CONTRAST TECHNIQUE: Contiguous axial images were obtained from the base of the skull through the vertex without intravenous contrast. COMPARISON:  February 05, 2017 FINDINGS: Brain: No subdural, epidural, or subarachnoid hemorrhage.  No mass, mass effect, or midline shift. Ventricles and sulci are prominent but stable. Cerebellum, brainstem, and basal cisterns are unremarkable. Moderate to severe white matter changes are stable. No acute cortical ischemia or infarct is identified. No mass, mass effect, or midline shift. Vascular: Previous aneurysm repair with a clip in the suprasellar cistern, unchanged. This results in significant artifact limiting further evaluation. Skull: Stable right craniotomy, healed. No acute bony abnormalities. Sinuses/Orbits: No acute finding. Other: None. IMPRESSION: No acute  intracranial process identified. Chronic small vessel white matter changes. Electronically Signed   By: Gerome Sam III M.D   On: 02/06/2017 11:55    EKG:   Orders placed or performed during the hospital encounter of 02/05/17  . ED EKG  . ED EKG      Management plans discussed with the patient, family and they are in agreement.  CODE STATUS:     Code Status Orders        Start     Ordered   02/05/17 1612  Do not attempt resuscitation (DNR)  Continuous    Question Answer Comment  In the event of cardiac or respiratory ARREST Do not call a "code blue"   In the event of cardiac or respiratory ARREST Do not perform Intubation, CPR, defibrillation or ACLS   In the event of cardiac or respiratory ARREST Use medication by any route, position, wound care, and other measures to relive pain and suffering. May use oxygen, suction and manual treatment of airway obstruction as needed for comfort.      02/05/17 1611    Code Status History    Date Active Date Inactive Code Status Order ID Comments User Context   01/15/2017  9:42 PM 01/23/2017  4:02 PM DNR [id number]  Tonye Royalty, DO Inpatient    Advance Directive Documentation     Most Recent Value  Type of Advance Directive  Healthcare Power of Attorney, Out of facility DNR (pink MOST or yellow form)  Pre-existing out of facility DNR order (yellow form or pink MOST form)  Physician notified to receive inpatient order  "MOST" Form in Place?  -      TOTAL TIME TAKING CARE OF THIS PATIENT: 35 minutes.    Altamese Dilling M.D on 02/08/2017 at 10:02 AM  Between 7am to 6pm - Pager - [phone]  After 6pm go to [email or web address] - password [email or web address]  [facility]  Office  [phone]  CC: Primary care physician; BABAOFF, Lavada Mesi, MD   Note: This dictation was prepared with Dragon dictation along with smaller phrase technology. Any transcriptional errors that result from this process are unintentional.

## 2017-02-08 NOTE — Care Management Important Message (Signed)
Important Message  Patient Details  Name: [name] MRN: [id number] Date of Birth: 06/11/1926   Medicare Important Message Given:  Yes    [name], RN 02/08/2017, 9:37 AM

## 2017-02-08 NOTE — Clinical Social Work Placement (Signed)
   CLINICAL SOCIAL WORK PLACEMENT  NOTE  Date:  02/08/2017  Patient Details  Name: [name] MRN: [id number] Date of Birth: 04/21/1926  Clinical Social Work is seeking post-discharge placement for this patient at the Skilled  Nursing Facility level of care (*CSW will initial, date and re-position this form in  chart as items are completed):  Yes   Patient/family provided with [facility] Department's list of facilities offering this level of care within the geographic area requested by the patient (or if unable, by the patient's family).  Yes   Patient/family informed of their freedom to choose among providers that offer the needed level of care, that participate in Medicare, Medicaid or managed care program needed by the patient, have an available bed and are willing to accept the patient.  Yes   Patient/family informed of [facility]'s ownership interest in [facility] and [facility], as well as of the fact that they are under no obligation to receive care at these facilities.  PASRR submitted to EDS on       PASRR number received on       Existing PASRR number confirmed on 02/06/17     FL2 transmitted to all facilities in geographic area requested by pt/family on 02/06/17     FL2 transmitted to all facilities within larger geographic area on       Patient informed that his/her managed care company has contracts with or will negotiate with certain facilities, including the following:        Yes   Patient/family informed of bed offers received.  Patient chooses bed at [facility]     Physician recommends and patient chooses bed at      Patient to be transferred [facility] on 02/08/17.  [facility]     Patient family notified on 02/08/17 of transfer.  Name of family member notified:  Pt's daughter, [name]     PHYSICIAN       Additional Comment:     _______________________________________________ Dede QuerySarah Arli Bree, LCSW 02/08/2017, 2:12 PM

## 2017-02-08 NOTE — Progress Notes (Signed)
Pt is d/ced to [facility].  Called report to [place] @ [phone].  Pt was lethargic today after receiving ativan in addition to trazedone last night.  Pt had been very restless.  She remains confused and is oriented only to self. She did take medications well - whole w/water.  Was incontinent of bowel and bladder.  IV removed by Nurse Tech.  Pt was transferred via EMS.

## 2017-02-08 NOTE — Clinical Social Work Note (Signed)
Pt is ready discharge today and will go to [facility]. Pt's daughter in law is aware and agreeable to discharge plan. Facility is ready to admit pt as they have received discharge information. RN will call report. [facility] will provide [facility]. CSW is signing off as no further needs identified.   Dede QuerySarah Rondey Fallen, MSW, LCSW  Clinical Social Worker  [phone]

## 2017-02-12 ENCOUNTER — Ambulatory Visit: Payer: Medicare Other | Admitting: Family

## 2018-09-12 IMAGING — CR DG CHEST 2V
1 series · 2 of 2 positions shown · non-contrast
Comparison: 01/15/2017

CLINICAL DATA: Deep venous thrombosis. For comparison with VQ scan.

EXAM:
CHEST  2 VIEW

[Series 1: x chest ap · 0.14mm/px · 2 of 2 slices shown]
[im 1/2]
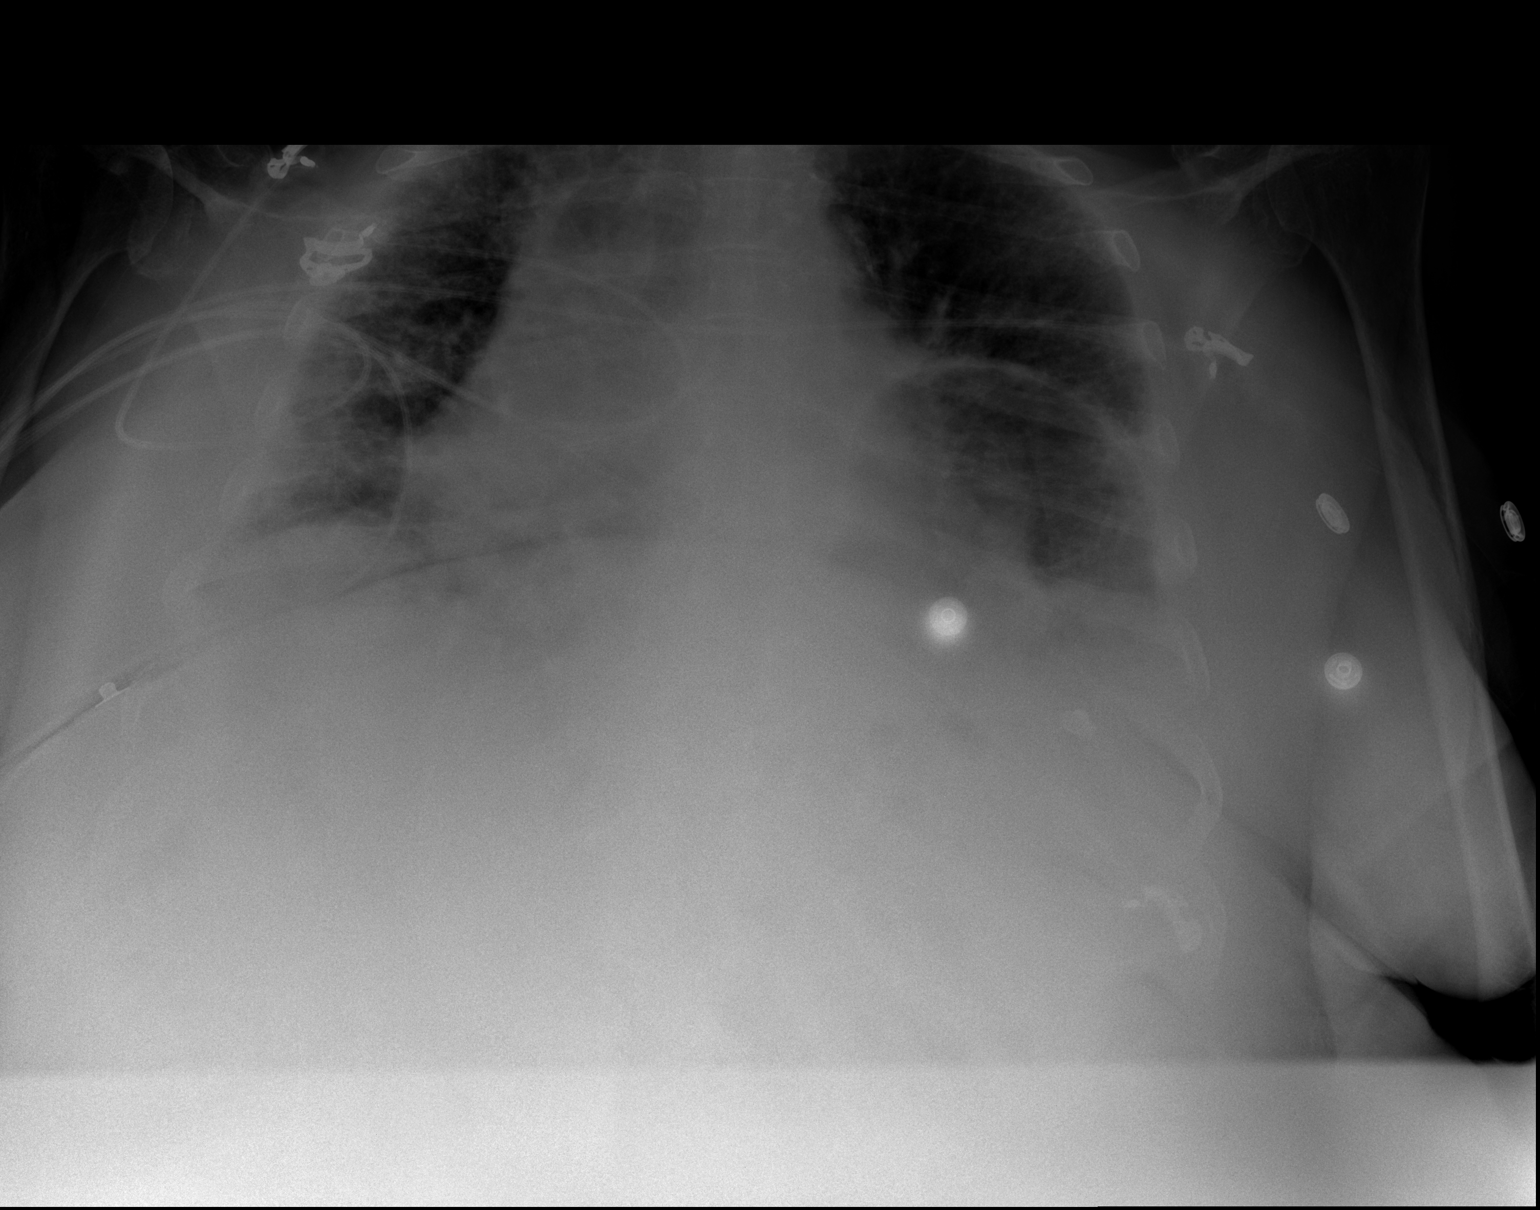
[im 2/2]
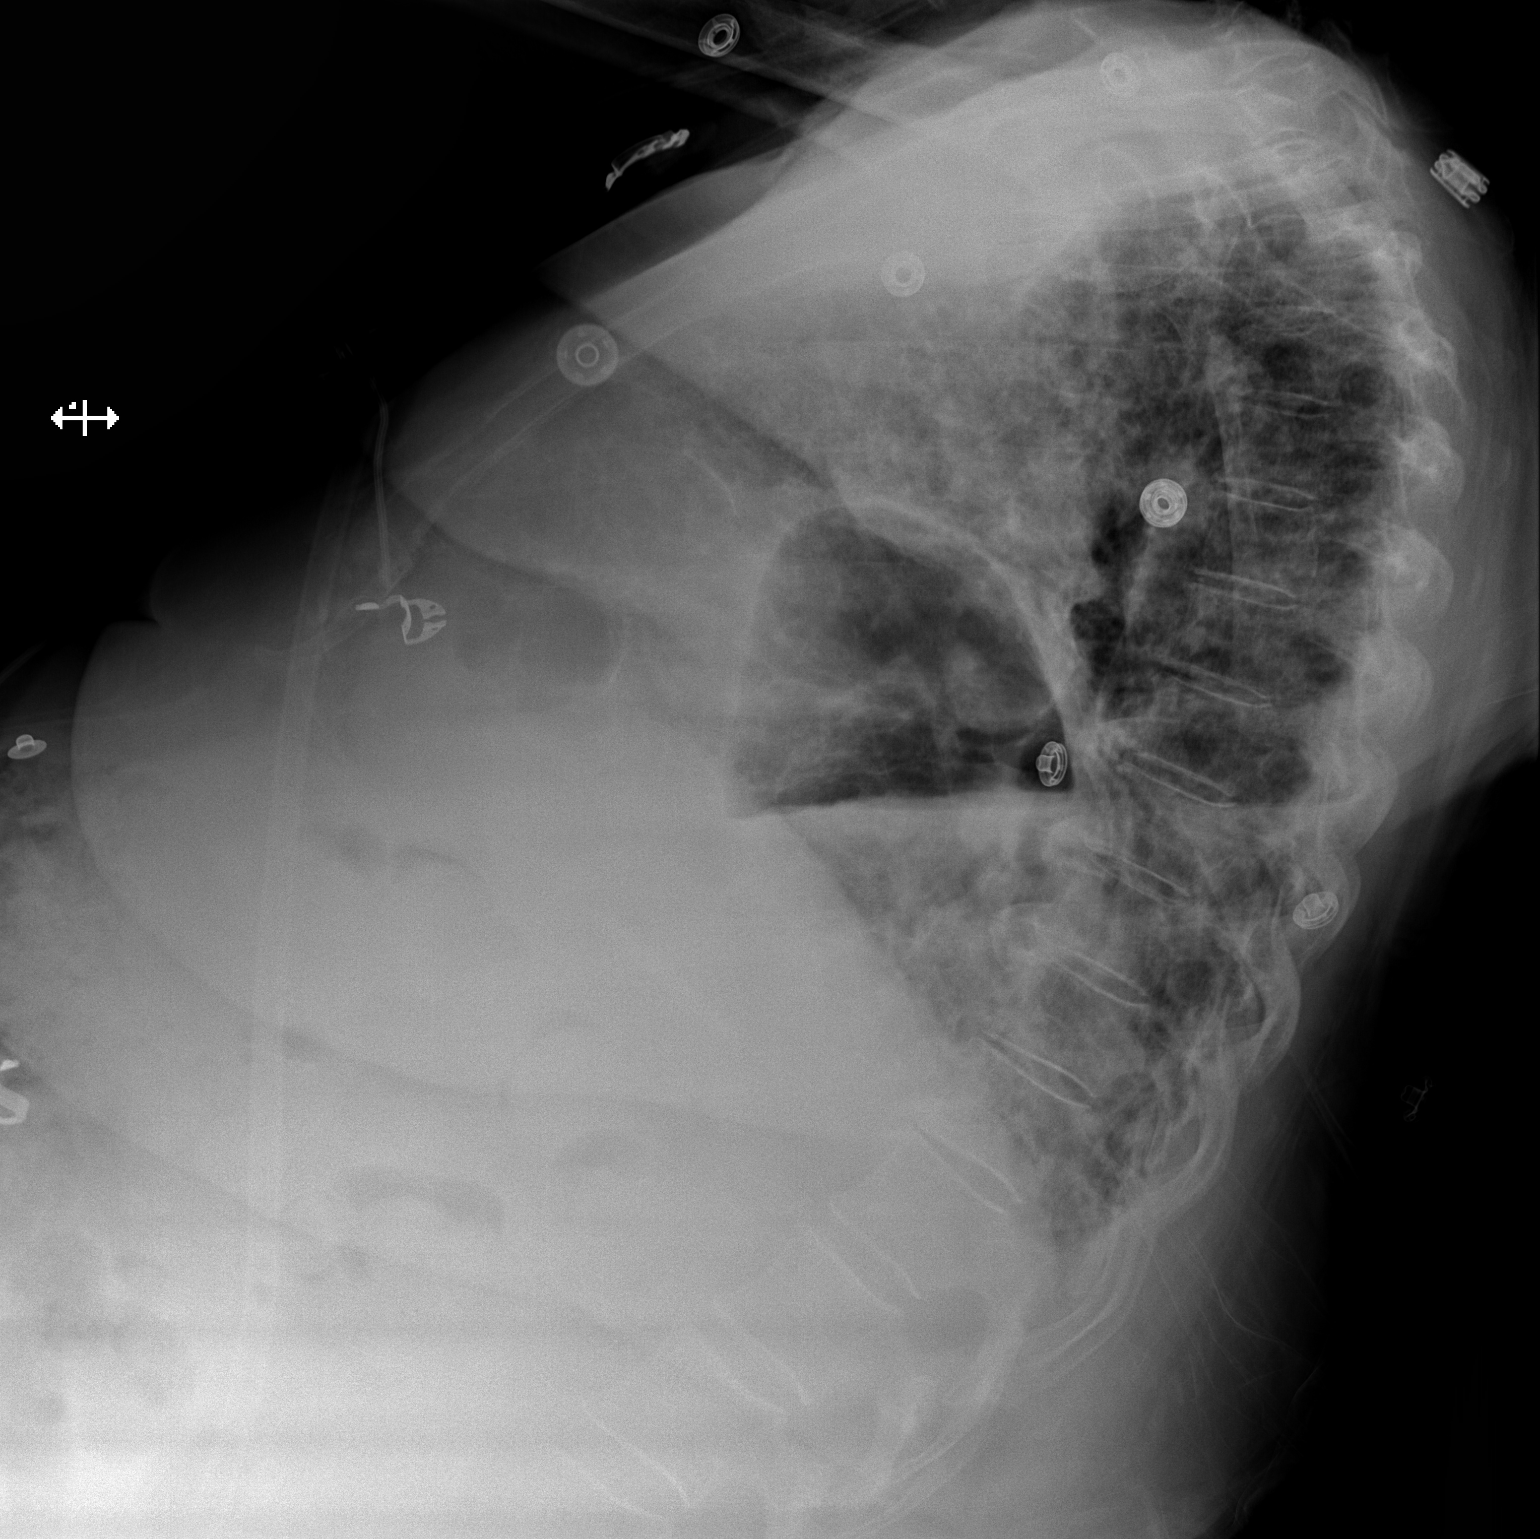

[2 of 2 positions shown; findings below may reference images not displayed]

FINDINGS: Very low lung volumes. Elevation of the left hemidiaphragm.
Cardiomegaly with vascular congestion and bibasilar atelectasis. No
effusions or acute bony abnormality.
IMPRESSION: Very low lung volumes with bibasilar atelectasis and vascular
congestion.

## 2018-10-01 IMAGING — CT CT HEAD W/O CM
3 series · 14 of 46 positions shown, 16 images · non-contrast
Comparison: February 05, 2017

CLINICAL DATA: Confusion.

EXAM:
CT HEAD WITHOUT CONTRAST
TECHNIQUE: Contiguous axial images were obtained from the base of the skull
through the vertex without intravenous contrast.

[Series 2: head wo · axial · 0.47mm/px · z∈[-121,-1]mm · 8 of 29 slices shown, 10 images]
[im 3/29  brain]
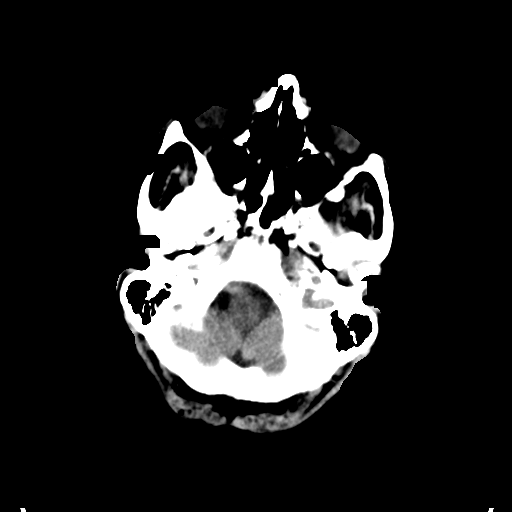
[im 3/29  bone]
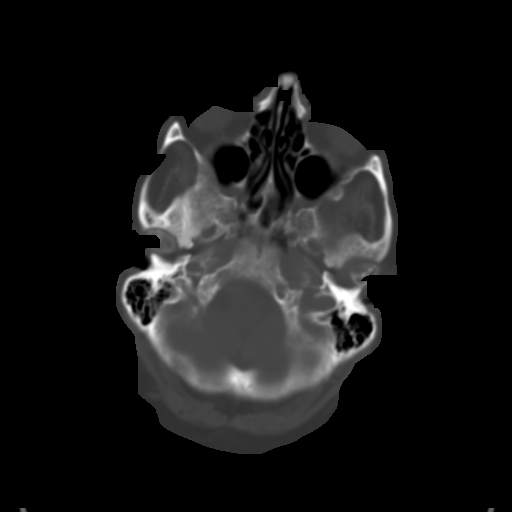
[im 7/29  brain]
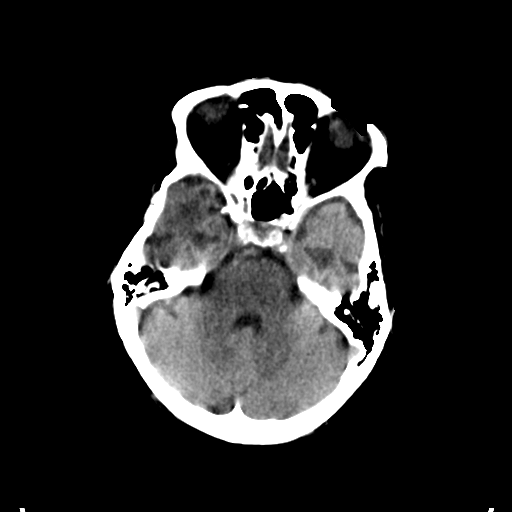
[im 10/29  brain]
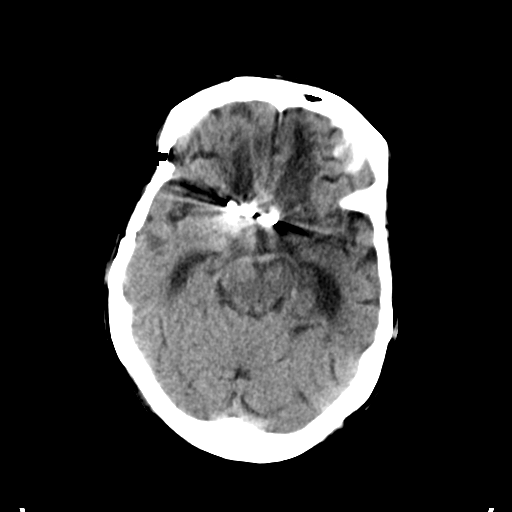
[im 13/29  brain]
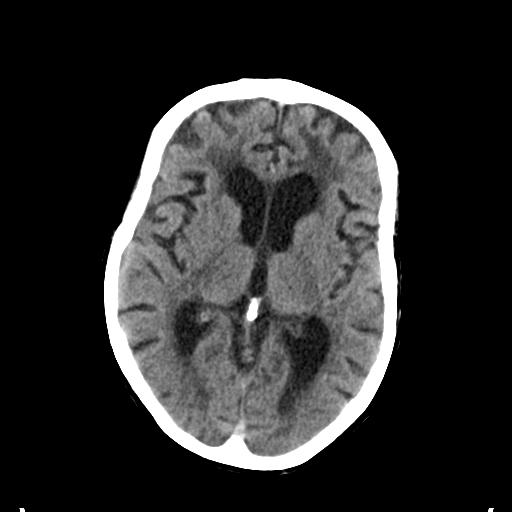
[im 17/29  brain]
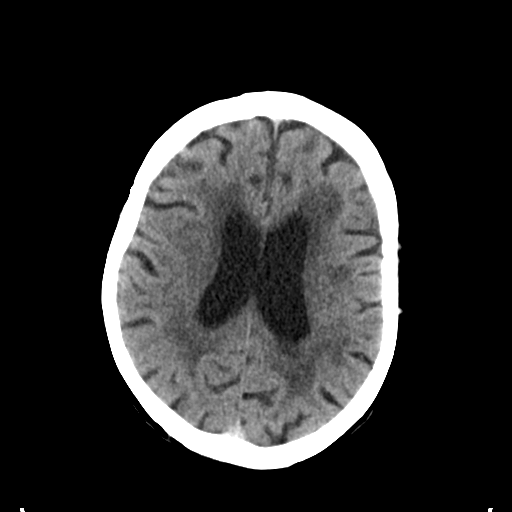
[im 17/29  bone]
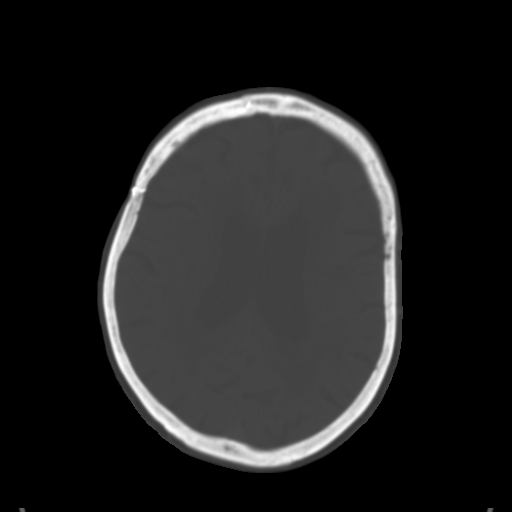
[im 20/29  brain]
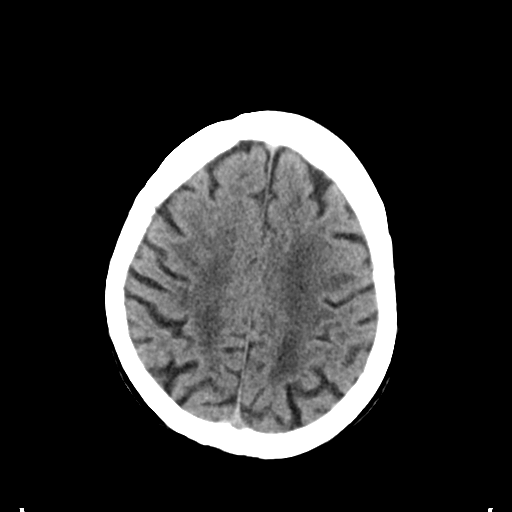
[im 23/29  brain]
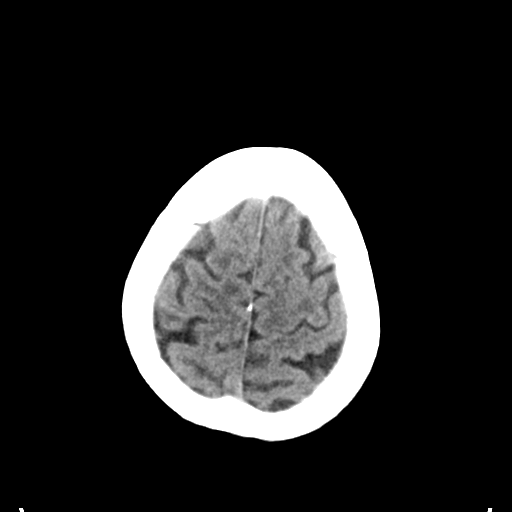
[im 27/29  brain]
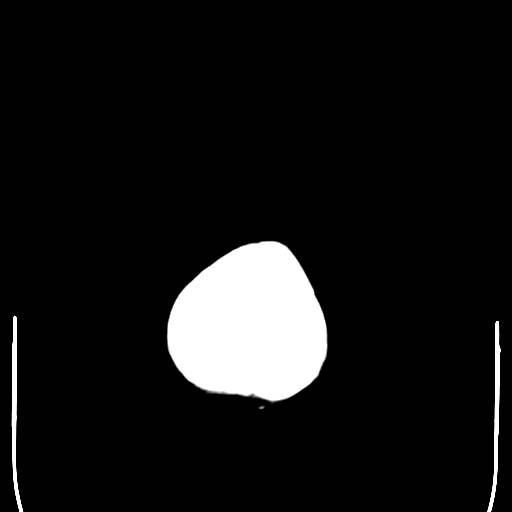

[Series 4: coronal soft tissue · coronal · 0.29mm/px · 3 of 61 slices shown]
[im 21/61  brain]
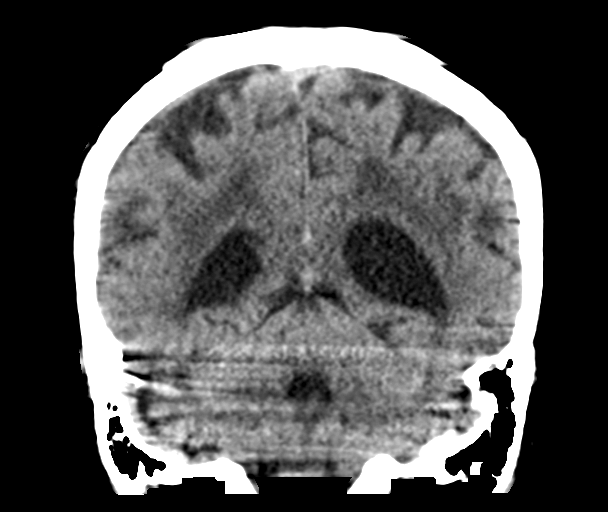
[im 27/61  brain]
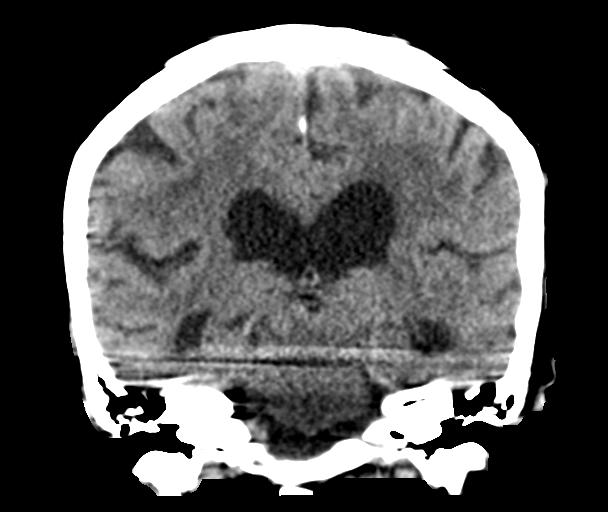
[im 34/61  brain]
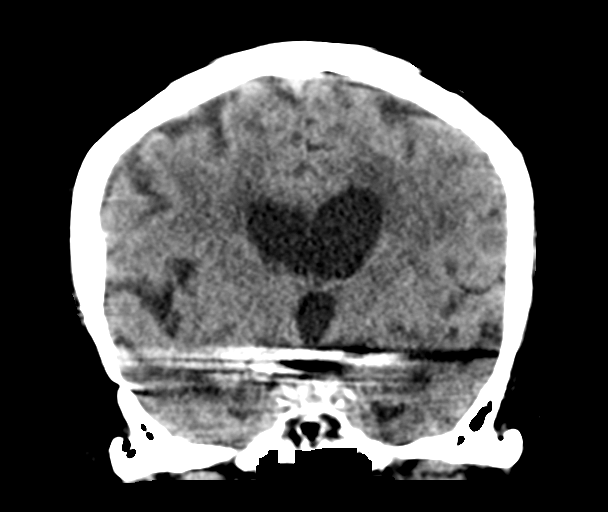

[Series 5: sagittal soft tissue · sagittal · 0.28mm/px · 3 of 48 slices shown]
[im 16/48  brain]
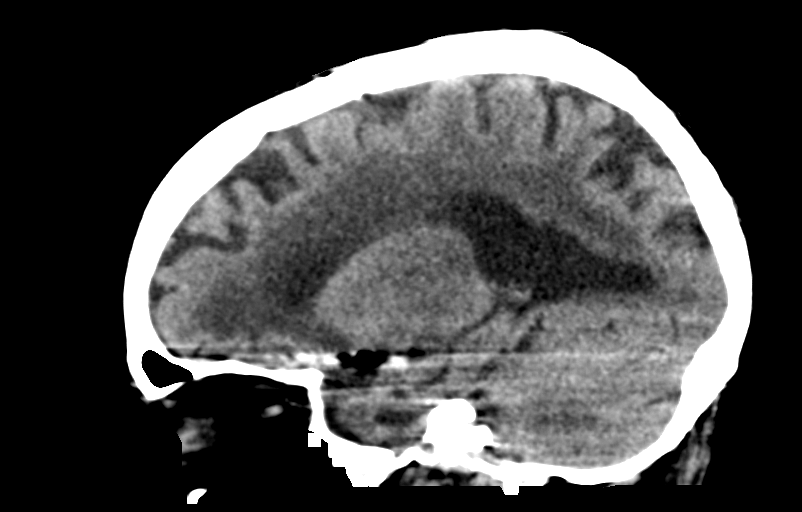
[im 24/48  brain]
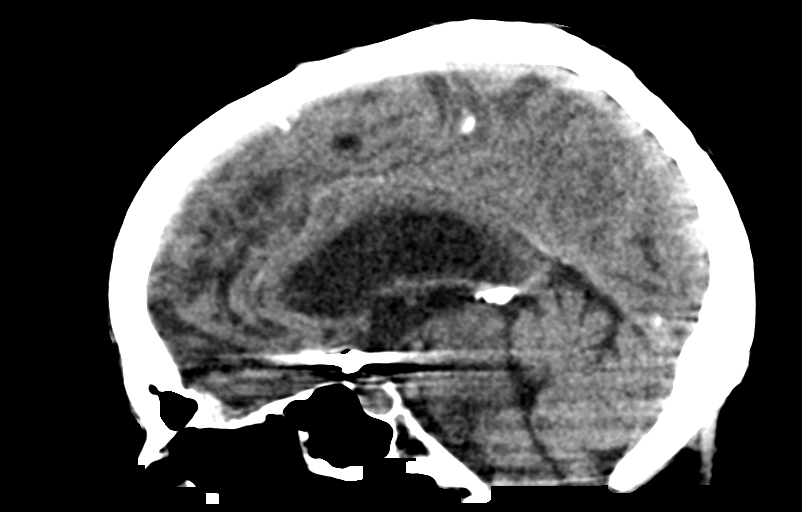
[im 32/48  brain]
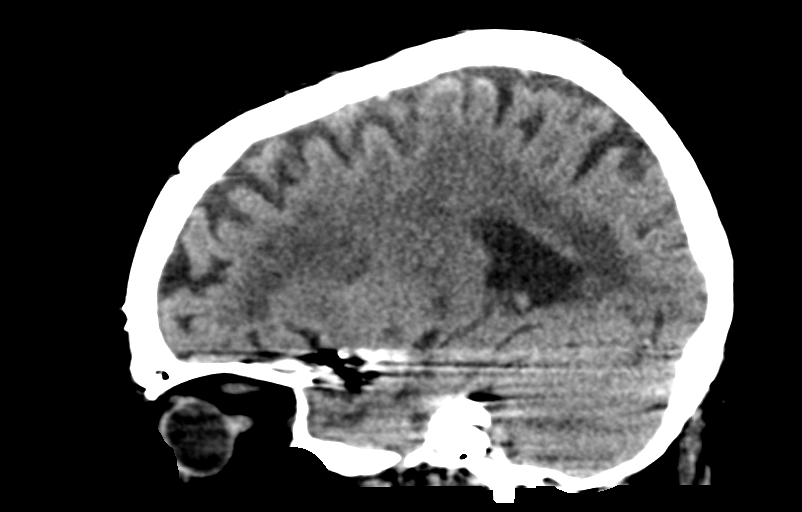

[14 of 46 positions shown; findings below may reference images not displayed]

FINDINGS: Brain: No subdural, epidural, or subarachnoid hemorrhage. No mass,
mass effect, or midline shift. Ventricles and sulci are prominent
but stable. Cerebellum, brainstem, and basal cisterns are
unremarkable. Moderate to severe white matter changes are stable. No
acute cortical ischemia or infarct is identified. No mass, mass
effect, or midline shift.

Vascular: Previous aneurysm repair with a clip in the suprasellar
cistern, unchanged. This results in significant artifact limiting
further evaluation.

Skull: Stable right craniotomy, healed. No acute bony abnormalities.

Sinuses/Orbits: No acute finding.

Other: None.
IMPRESSION: No acute intracranial process identified. Chronic small vessel white
matter changes.

## 2018-11-23 DEATH — deceased
